# Patient Record
Sex: Female | Born: 1990 | Race: Black or African American | Hispanic: No | Marital: Single | Smoking: Current some day smoker
Health system: Southern US, Community
[De-identification: ages and names within clinical notes are randomized; demographics above are authoritative.]

## PROBLEM LIST (undated history)

## (undated) DIAGNOSIS — I10 Essential (primary) hypertension: Secondary | ICD-10-CM

---

## 2015-11-26 ENCOUNTER — Encounter (HOSPITAL_BASED_OUTPATIENT_CLINIC_OR_DEPARTMENT_OTHER): Payer: Self-pay | Admitting: *Deleted

## 2015-11-26 ENCOUNTER — Emergency Department (HOSPITAL_BASED_OUTPATIENT_CLINIC_OR_DEPARTMENT_OTHER)
Admission: EM | Admit: 2015-11-26 | Discharge: 2015-11-26 | Disposition: A | Discharge status: Home / Self Care | Payer: Self-pay | Attending: Emergency Medicine | Admitting: Emergency Medicine

## 2015-11-26 DIAGNOSIS — I10 Essential (primary) hypertension: Secondary | ICD-10-CM | POA: Insufficient documentation

## 2015-11-26 DIAGNOSIS — B0089 Other herpesviral infection: Secondary | ICD-10-CM | POA: Insufficient documentation

## 2015-11-26 DIAGNOSIS — Z113 Encounter for screening for infections with a predominantly sexual mode of transmission: Secondary | ICD-10-CM | POA: Insufficient documentation

## 2015-11-26 DIAGNOSIS — A599 Trichomoniasis, unspecified: Secondary | ICD-10-CM | POA: Insufficient documentation

## 2015-11-26 DIAGNOSIS — A6000 Herpesviral infection of urogenital system, unspecified: Secondary | ICD-10-CM

## 2015-11-26 HISTORY — DX: Essential (primary) hypertension: I10

## 2015-11-26 LAB — WET PREP, GENITAL
Sperm: NONE SEEN
Yeast Wet Prep HPF POC: NONE SEEN

## 2015-11-26 LAB — URINE MICROSCOPIC-ADD ON

## 2015-11-26 LAB — URINALYSIS, ROUTINE W REFLEX MICROSCOPIC
BILIRUBIN URINE: NEGATIVE
Glucose, UA: NEGATIVE mg/dL
KETONES UR: NEGATIVE mg/dL
NITRITE: NEGATIVE
PH: 7 (ref 5.0–8.0)
PROTEIN: NEGATIVE mg/dL
Specific Gravity, Urine: 1.015 (ref 1.005–1.030)

## 2015-11-26 LAB — PREGNANCY, URINE: Preg Test, Ur: NEGATIVE

## 2015-11-26 MED ORDER — ACYCLOVIR 400 MG PO TABS: 400.0000 mg | ORAL_TABLET | Freq: Three times a day (TID) | ORAL | 0 refills | Status: AC

## 2015-11-26 MED ORDER — CEFTRIAXONE SODIUM 250 MG IJ SOLR
250.0000 mg | Freq: Once | INTRAMUSCULAR | Status: AC
  Administered 2015-11-26: 250 mg via INTRAMUSCULAR
  Filled 2015-11-26: qty 250

## 2015-11-26 MED ORDER — METRONIDAZOLE 500 MG PO TABS
2000.0000 mg | ORAL_TABLET | Freq: Once | ORAL | Status: AC
  Administered 2015-11-26: 2000 mg via ORAL
  Filled 2015-11-26: qty 4

## 2015-11-26 MED ORDER — ONDANSETRON 4 MG PO TBDP
4.0000 mg | ORAL_TABLET | Freq: Once | ORAL | Status: AC
  Administered 2015-11-26: 4 mg via ORAL
  Filled 2015-11-26: qty 1

## 2015-11-26 MED ORDER — AZITHROMYCIN 1 G PO PACK
1.0000 g | PACK | Freq: Once | ORAL | Status: AC
  Administered 2015-11-26: 1 g via ORAL
  Filled 2015-11-26: qty 1

## 2015-11-26 NOTE — ED Notes (Signed)
Lab unable to run Herpes on swab collected. Lab has blood available for Herpes testing from blood work. Order changed.

## 2015-11-26 NOTE — Discharge Instructions (Signed)
Take the Acyclovir as prescribe. Follow up with your OB[facility]GYN [facility]. Your HIV, syphilis, gonorrhea, chlamydia, herpes test are still pending. If anything is positive you receive a phone call. You need to inform all your sexual partners that you've been treated for gonorrhea, chlamydia, trichomoniasis as they will need to be treated also. Be sure to use protection when having sex. You can follow-up with the health department for future STD testing. Return to emergency while with worsening symptoms or new concerning symptoms.

## 2015-11-26 NOTE — ED Triage Notes (Signed)
Pt c/o vaginal and rectal pain with lesions x 1 week

## 2015-11-26 NOTE — ED Provider Notes (Signed)
[facility]   CSN: [id number] Arrival date & time: 11/26/15  1634  By signing my name below, I, Patricia Logan, attest that this documentation has been prepared under the direction and in the presence of Patricia Marlin, PA-C. Electronically Signed: Freida Logan, Scribe. 11/26/2015. 7:20 PM. History   Chief Complaint Chief Complaint  Patient presents with  . Vaginal Pain   The history is provided by the patient. No language interpreter was used.    HPI Comments:  Patricia Logan is a 25 y.o. female who presents to the Emergency Department complaining of small vaginal "tear"/ lesion x 1 week with associated pruritic bumps to the anus. She denies dysuria but notes burning pain to the skin when she urinates. Pt is sexually active with both men and women and admits to having unprotected sex. She denies having sexual partners with similar symptoms. Pt also notes white vaginal discharge; no foul odor or vaginal itching. No abdominal pain, fever, nausea, vomiting, or blood in stool.  Past Medical History:  Diagnosis Date  . Hypertension     There are no active problems to display for this patient.   History reviewed. No pertinent surgical history.  OB History    No data available       Home Medications    Prior to Admission medications   Medication Sig Start Date End Date Taking? Authorizing Provider  acyclovir (ZOVIRAX) 400 MG tablet Take 1 tablet (400 mg total) by mouth 3 (three) times daily. 11/26/15   Jerre Simon, PA    Family History History reviewed. No pertinent family history.  Social History Social History  Substance Use Topics  . Smoking status: Never Smoker  . Smokeless tobacco: Never Used  . Alcohol use No     Allergies   Patient has no known allergies.   Review of Systems Review of Systems  Constitutional: Negative for fever.  Gastrointestinal: Negative for abdominal pain, blood in stool, nausea and vomiting.    Genitourinary: Positive for genital sores (tear/lesion) and vaginal discharge. Negative for dysuria.  All other systems reviewed and are negative.    Physical Exam Updated Vital Signs BP (!) 179/112   Pulse 90   Temp 98.1 F (36.7 C)   Resp 16   Ht 5\' 11"  (1.803 m)   Wt 245 lb (111.1 kg)   LMP 10/30/2015   SpO2 100%   BMI 34.17 kg/m   Physical Exam  Constitutional: She is oriented to person, place, and time. She appears well-developed and well-nourished. No distress.  HENT:  Head: Normocephalic and atraumatic.  Eyes: Conjunctivae are normal.  Neck: Normal range of motion.  Cardiovascular: Normal rate, regular rhythm and normal heart sounds.   No murmur heard. Pulmonary/Chest: Effort normal.  Abdominal: Soft. Normal appearance and bowel sounds are normal. She exhibits no distension. There is no tenderness.  Genitourinary:  Genitourinary Comments: Exam performed by Jerre Simon,  exam chaperoned Date: 11/26/2015 Pelvic exam: normal external genitalia without evidence of trauma. VULVA: ulcer appearing lesions noted to vulva and perineal region that are TTP. VAGINA: normal appearing vagina with normal color and discharge, no lesions. CERVIX: cervix with erythema and red lesions surrounding the os, cervical motion tenderness absent, cervical os closed with out purulent discharge; vaginal discharge - white, copious, creamy, mucoid and thick, Wet prep and DNA probe for chlamydia and GC obtained.   ADNEXA: normal adnexa in size, nontender and no masses UTERUS: uterus is normal size, shape, consistency and nontender.  Chaperone was present for exam which was performed with no discomfort or complications.   Musculoskeletal: Normal range of motion.  Neurological: She is alert and oriented to person, place, and time.  Skin: Skin is warm and dry. She is not diaphoretic.  Psychiatric: She has a normal mood and affect.  Nursing note and vitals reviewed.    ED Treatments /  Results  DIAGNOSTIC STUDIES:  Oxygen Saturation is 100% on RA, normal by my interpretation.    COORDINATION OF CARE:  7:04 PM Discussed treatment plan with pt at bedside and pt agreed to plan.  Labs (all labs ordered are listed, but only abnormal results are displayed) Labs Reviewed  WET PREP, GENITAL - Abnormal; Notable for the following:       Result Value   Trich, Wet Prep PRESENT (*)    Clue Cells Wet Prep HPF POC PRESENT (*)    WBC, Wet Prep HPF POC MANY (*)    All other components within normal limits  URINALYSIS, ROUTINE W REFLEX MICROSCOPIC (NOT AT [facility]) - Abnormal; Notable for the following:    Hgb urine dipstick TRACE (*)    Leukocytes, UA MODERATE (*)    All other components within normal limits  URINE MICROSCOPIC-ADD ON - Abnormal; Notable for the following:    Squamous Epithelial / LPF 0-5 (*)    Bacteria, UA RARE (*)    All other components within normal limits  PREGNANCY, URINE  RPR  HIV ANTIBODY (ROUTINE TESTING)  HERPES SIMPLEX VIRUS(HSV) DNA BY PCR  GC/CHLAMYDIA PROBE AMP ([place]) NOT AT [facility]    EKG  EKG Interpretation None       Radiology No results found.  Procedures Procedures (including critical care time)  Medications Ordered in ED Medications  metroNIDAZOLE (FLAGYL) tablet 2,000 mg (2,000 mg Oral Given 11/26/15 2037)  cefTRIAXone (ROCEPHIN) injection 250 mg (250 mg Intramuscular Given 11/26/15 2037)  azithromycin (ZITHROMAX) powder 1 g (1 g Oral Given 11/26/15 2037)  ondansetron (ZOFRAN-ODT) disintegrating tablet 4 mg (4 mg Oral Given 11/26/15 2037)     Initial Impression / Assessment and Plan / ED Course  I have reviewed the triage vital signs and the nursing notes.  Pertinent labs & imaging results that were available during my care of the patient were reviewed by me and considered in my medical decision making (see chart for details).  Clinical Course    Patient treated in the ED for STI with Azithromycin, Rocephin, Flagyl.  Patient was found to have trichomoniasis on wet prep. Patient's exam concerning for herpes. Patient advised to inform and treat all sexual partners.  Pt advised on safe sex practices and understands that they have GC/Chlamydia and herpes cultures pending and will result in 2-3 days. HIV and RPR sent. Patient discharged with acyclovir. Inserted patient follow with her OB/GYN on Monday to be reevaluated. Pt encouraged to follow up at local health department for future STI checks. No concern for PID. Discussed return precautions. Pt appears safe for discharge.   Final Clinical Impressions(s) / ED Diagnoses   Final diagnoses:  Trichimoniasis  Screen for STD (sexually transmitted disease)  Genital herpes simplex, unspecified site    New Prescriptions Discharge Medication List as of 11/26/2015  8:53 PM    START taking these medications   Details  acyclovir (ZOVIRAX) 400 MG tablet Take 1 tablet (400 mg total) by mouth 3 (three) times daily., Starting Fri 11/26/2015, Print       I personally performed the services described in  this documentation, which was scribed in my presence. The recorded information has been reviewed and is accurate.       Jerre SimonJessica L Ausha Sieh, PA 11/26/15 2151    Laurence Spatesachel Morgan Little, MD 11/30/15 [phone]

## 2015-11-28 LAB — HSV(HERPES SIMPLEX VRS) I + II AB-IGG
HSV 1 Glycoprotein G Ab, IgG: 19.2 index — ABNORMAL HIGH (ref 0.00–0.90)
HSV 2 Glycoprotein G Ab, IgG: <0.91 index (ref 0.00–0.90)

## 2015-11-28 LAB — RPR: RPR: NONREACTIVE

## 2015-11-28 LAB — HIV ANTIBODY (ROUTINE TESTING W REFLEX): HIV Screen 4th Generation wRfx: NONREACTIVE

## 2015-12-01 LAB — GC/CHLAMYDIA PROBE AMP (~~LOC~~) NOT AT ARMC
Chlamydia: NEGATIVE
NEISSERIA GONORRHEA: NEGATIVE

## 2019-09-14 ENCOUNTER — Emergency Department (HOSPITAL_BASED_OUTPATIENT_CLINIC_OR_DEPARTMENT_OTHER)
Admission: EM | Admit: 2019-09-14 | Discharge: 2019-09-15 | Disposition: A | Discharge status: Home / Self Care | Payer: BLUE CROSS/BLUE SHIELD | Attending: Emergency Medicine | Admitting: Emergency Medicine

## 2019-09-14 ENCOUNTER — Other Ambulatory Visit: Payer: Self-pay

## 2019-09-14 ENCOUNTER — Encounter (HOSPITAL_BASED_OUTPATIENT_CLINIC_OR_DEPARTMENT_OTHER): Payer: Self-pay | Admitting: Emergency Medicine

## 2019-09-14 DIAGNOSIS — R102 Pelvic and perineal pain: Secondary | ICD-10-CM | POA: Diagnosis not present

## 2019-09-14 DIAGNOSIS — Z5321 Procedure and treatment not carried out due to patient leaving prior to being seen by health care provider: Secondary | ICD-10-CM | POA: Insufficient documentation

## 2019-09-14 NOTE — ED Triage Notes (Signed)
Reports pelvic pain for the last three days.  Denies any urinary symptoms or discharge.  Endorses pain is worse with having a bm.

## 2020-06-15 ENCOUNTER — Other Ambulatory Visit: Payer: Self-pay

## 2020-06-15 ENCOUNTER — Encounter (HOSPITAL_BASED_OUTPATIENT_CLINIC_OR_DEPARTMENT_OTHER): Payer: Self-pay | Admitting: Emergency Medicine

## 2020-06-15 ENCOUNTER — Emergency Department (HOSPITAL_BASED_OUTPATIENT_CLINIC_OR_DEPARTMENT_OTHER)
Admission: EM | Admit: 2020-06-15 | Discharge: 2020-06-16 | Disposition: A | Discharge status: Home / Self Care | Payer: BLUE CROSS/BLUE SHIELD | Attending: Emergency Medicine | Admitting: Emergency Medicine

## 2020-06-15 DIAGNOSIS — R0789 Other chest pain: Secondary | ICD-10-CM | POA: Insufficient documentation

## 2020-06-15 DIAGNOSIS — R072 Precordial pain: Secondary | ICD-10-CM | POA: Diagnosis not present

## 2020-06-15 DIAGNOSIS — I10 Essential (primary) hypertension: Secondary | ICD-10-CM | POA: Insufficient documentation

## 2020-06-15 DIAGNOSIS — R0602 Shortness of breath: Secondary | ICD-10-CM | POA: Insufficient documentation

## 2020-06-15 DIAGNOSIS — F17290 Nicotine dependence, other tobacco product, uncomplicated: Secondary | ICD-10-CM | POA: Insufficient documentation

## 2020-06-15 DIAGNOSIS — Z79899 Other long term (current) drug therapy: Secondary | ICD-10-CM | POA: Insufficient documentation

## 2020-06-15 NOTE — ED Triage Notes (Signed)
CP and SOB started about 2-3pm left work tonight because it has gotten worse. No HX. Upper mid-left chest does not radiate.

## 2020-06-16 ENCOUNTER — Emergency Department (HOSPITAL_BASED_OUTPATIENT_CLINIC_OR_DEPARTMENT_OTHER): Payer: BLUE CROSS/BLUE SHIELD

## 2020-06-16 LAB — CBC WITH DIFFERENTIAL/PLATELET
Abs Immature Granulocytes: 0.02 10*3/uL (ref 0.00–0.07)
Basophils Absolute: 0.1 10*3/uL (ref 0.0–0.1)
Basophils Relative: 1 %
Eosinophils Absolute: 0.5 10*3/uL (ref 0.0–0.5)
Eosinophils Relative: 5 %
HCT: 39.4 % (ref 36.0–46.0)
Hemoglobin: 12.6 g/dL (ref 12.0–15.0)
Immature Granulocytes: 0 %
Lymphocytes Relative: 45 %
Lymphs Abs: 4.1 10*3/uL — ABNORMAL HIGH (ref 0.7–4.0)
MCH: 27.1 pg (ref 26.0–34.0)
MCHC: 32 g/dL (ref 30.0–36.0)
MCV: 84.7 fL (ref 80.0–100.0)
Monocytes Absolute: 0.5 10*3/uL (ref 0.1–1.0)
Monocytes Relative: 6 %
Neutro Abs: 4 10*3/uL (ref 1.7–7.7)
Neutrophils Relative %: 43 %
Platelets: 386 10*3/uL (ref 150–400)
RBC: 4.65 MIL/uL (ref 3.87–5.11)
RDW: 13.9 % (ref 11.5–15.5)
WBC: 9.2 10*3/uL (ref 4.0–10.5)
nRBC: 0 % (ref 0.0–0.2)

## 2020-06-16 LAB — COMPREHENSIVE METABOLIC PANEL
ALT: 21 U/L (ref 0–44)
AST: 20 U/L (ref 15–41)
Albumin: 4.5 g/dL (ref 3.5–5.0)
Alkaline Phosphatase: 50 U/L (ref 38–126)
Anion gap: 10 (ref 5–15)
BUN: 13 mg/dL (ref 6–20)
CO2: 30 mmol/L (ref 22–32)
Calcium: 9.8 mg/dL (ref 8.9–10.3)
Chloride: 99 mmol/L (ref 98–111)
Creatinine, Ser: 0.81 mg/dL (ref 0.44–1.00)
GFR, Estimated: >60 mL/min (ref >60)
Glucose, Bld: 122 mg/dL — ABNORMAL HIGH (ref 70–99)
Potassium: 3.4 mmol/L — ABNORMAL LOW (ref 3.5–5.1)
Sodium: 139 mmol/L (ref 135–145)
Total Bilirubin: 0.2 mg/dL — ABNORMAL LOW (ref 0.3–1.2)
Total Protein: 8 g/dL (ref 6.5–8.1)

## 2020-06-16 LAB — LIPASE, BLOOD: Lipase: 37 U/L (ref 11–51)

## 2020-06-16 LAB — BRAIN NATRIURETIC PEPTIDE: B Natriuretic Peptide: 6.1 pg/mL (ref 0.0–100.0)

## 2020-06-16 LAB — TROPONIN I (HIGH SENSITIVITY)
Troponin I (High Sensitivity): 3 ng/L (ref <18)
Troponin I (High Sensitivity): 3 ng/L (ref <18)

## 2020-06-16 LAB — D-DIMER, QUANTITATIVE: D-Dimer, Quant: 0.37 ug/mL-FEU (ref 0.00–0.50)

## 2020-06-16 MED ORDER — CYCLOBENZAPRINE HCL 10 MG PO TABS: 10.0000 mg | ORAL_TABLET | Freq: Two times a day (BID) | ORAL | 0 refills | Status: AC | PRN

## 2020-06-16 NOTE — ED Provider Notes (Signed)
[facility] EMERGENCY DEPARTMENT Provider Note   CSN: [id number] Arrival date & time: 06/15/20  2340     History Chief Complaint  Patient presents with   Chest Pain    Izabella Jagoda is a 30 y.o. female.  The history is provided by the patient and medical records. No language interpreter was used.  Chest Pain Pain location:  L chest and substernal area Pain quality: aching, crushing, pressure and sharp   Pain quality: not radiating   Pain radiates to:  Does not radiate Pain severity:  Moderate Onset quality:  Gradual Duration:  2 weeks Timing:  Constant Progression:  Waxing and waning Chronicity:  New Relieved by:  Nothing Worsened by:  Deep breathing, exertion and movement Ineffective treatments:  None tried Associated symptoms: shortness of breath   Associated symptoms: no abdominal pain, no altered mental status, no back pain, no cough, no dizziness, no fatigue, no fever, no headache, no nausea, no near-syncope, no numbness, no palpitations, no vomiting and no weakness       Past Medical History:  Diagnosis Date   Hypertension     There are no problems to display for this patient.   History reviewed. No pertinent surgical history.   OB History   No obstetric history on file.     History reviewed. No pertinent family history.  Social History   Tobacco Use   Smoking status: Some Days    Pack years: 0.00    Types: Cigars   Smokeless tobacco: Never  Substance Use Topics   Alcohol use: No   Drug use: Yes    Comment: occ    Home Medications Prior to Admission medications   Medication Sig Start Date End Date Taking? Authorizing Provider  amLODipine (NORVASC) 5 MG tablet Take 1 tablet by mouth daily. 06/10/20  Yes [provider]  furosemide (LASIX) 20 MG tablet Take 20 mg by mouth.   Yes [provider]  losartan-hydrochlorothiazide (HYZAAR) 50-12.5 MG tablet Take 1 tablet by mouth daily. 06/10/20  Yes [provider]  acyclovir (ZOVIRAX) 400 MG tablet Take 1 tablet (400 mg total) by mouth 3 (three) times daily. 11/26/15   Jerre Simon, PA    Allergies    Patient has no known allergies.  Review of Systems   Review of Systems  Constitutional:  Negative for chills, fatigue and fever.  HENT:  Negative for congestion.   Respiratory:  Positive for shortness of breath. Negative for cough, chest tightness, wheezing and stridor.   Cardiovascular:  Positive for chest pain. Negative for palpitations, leg swelling (pt reports mild swelling in bilat ankles but not seen today) and near-syncope.  Gastrointestinal:  Negative for abdominal pain, constipation, diarrhea, nausea and vomiting.  Genitourinary:  Negative for dysuria.  Musculoskeletal:  Negative for back pain and neck pain.  Skin:  Negative for rash.  Neurological:  Negative for dizziness, weakness, light-headedness, numbness and headaches.  Psychiatric/Behavioral:  Negative for agitation.   All other systems reviewed and are negative.  Physical Exam Updated Vital Signs BP (!) 188/115 (BP Location: Left Arm)   Pulse 98   Temp 98.3 F (36.8 C) (Oral)   Resp 20   Ht 5\' 11"  (1.803 m)   Wt 122.5 kg   LMP 06/08/2020 (Exact Date)   SpO2 100%   BMI 37.66 kg/m   Physical Exam Vitals and nursing note reviewed.  Constitutional:      General: She is not in acute distress.    Appearance:  She is well-developed. She is not ill-appearing, toxic-appearing or diaphoretic.  HENT:     Head: Normocephalic and atraumatic.  Eyes:     Extraocular Movements: Extraocular movements intact.     Conjunctiva/sclera: Conjunctivae normal.     Pupils: Pupils are equal, round, and reactive to light.  Cardiovascular:     Rate and Rhythm: Regular rhythm. Tachycardia present.     Heart sounds: No murmur heard. Pulmonary:     Effort: Pulmonary effort is normal. No respiratory distress.     Breath sounds: Normal breath sounds. No decreased breath sounds, wheezing,  rhonchi or rales.  Chest:     Chest wall: Tenderness present.  Abdominal:     General: There is no abdominal bruit.     Palpations: Abdomen is soft.     Tenderness: There is no abdominal tenderness.  Musculoskeletal:     Cervical back: Neck supple.     Right lower leg: No tenderness. No edema.     Left lower leg: No tenderness. No edema.  Skin:    General: Skin is warm and dry.     Capillary Refill: Capillary refill takes less than 2 seconds.     Findings: No erythema.  Neurological:     General: No focal deficit present.     Mental Status: She is alert.  Psychiatric:        Mood and Affect: Mood normal.    ED Results / Procedures / Treatments   Labs (all labs ordered are listed, but only abnormal results are displayed) Labs Reviewed  CBC WITH DIFFERENTIAL/PLATELET - Abnormal; Notable for the following components:      Result Value   Lymphs Abs 4.1 (*)    All other components within normal limits  COMPREHENSIVE METABOLIC PANEL - Abnormal; Notable for the following components:   Potassium 3.4 (*)    Glucose, Bld 122 (*)    Total Bilirubin 0.2 (*)    All other components within normal limits  LIPASE, BLOOD  D-DIMER, QUANTITATIVE  BRAIN NATRIURETIC PEPTIDE  PREGNANCY, URINE  TROPONIN I (HIGH SENSITIVITY)  TROPONIN I (HIGH SENSITIVITY)    EKG EKG Interpretation  Date/Time:  Tuesday June 15 2020 23:48:58 EDT Ventricular Rate:  91 PR Interval:  202 QRS Duration: 86 QT Interval:  364 QTC Calculation: 447 R Axis:   60 Text Interpretation: Normal sinus rhythm T wave abnormality, consider inferior ischemia Abnormal ECG No prior ECG for comparison. T wave inversions present. No STEMI Confirmed by Theda Belfast ([id number]) on 06/15/2020 11:56:55 PM  Radiology DG Chest 2 View  Result Date: 06/16/2020 CLINICAL DATA:  Left side chest pain, shortness of breath EXAM: CHEST - 2 VIEW COMPARISON:  None. FINDINGS: Heart and mediastinal contours are within normal limits. No focal  opacities or effusions. No acute bony abnormality. IMPRESSION: No active cardiopulmonary disease. Electronically Signed   By: Charlett Nose M.D.   On: 06/16/2020 01:18    Procedures Procedures   Medications Ordered in ED Medications - No data to display  ED Course  I have reviewed the triage vital signs and the nursing notes.  Pertinent labs & imaging results that were available during my care of the patient were reviewed by me and considered in my medical decision making (see chart for details).    MDM Rules/Calculators/A&P                          Sadira Zamorano is a 30 y.o. female with a past  medical history significant for hypertension who presents with chest pain and shortness of breath.  According patient, for the last 2 weeks she has had daily pain in her left central chest that does not radiate.  She reports it is both pleuritic and exertional and she has felt lightheaded.  She reports that some shortness of breath.  She denies nausea, vomiting, fevers, chills, chest, or cough.  Denies constipation, diarrhea, urinary changes.  She thinks her ankles have been slightly swollen but acknowledges they do not seem swollen currently.  She reports no trauma.  No recent COVID exposures.  She reports the pain is a pressure and dull pain at this time.  She denies any change with eating, drinking, or spicy things.  On exam, chest is tender to palpation reproducing her discomfort.  Lungs were clear.  Abdomen nontender.  No edema appreciated.  Good pulses in all extremities.  Patient resting comfortably but was slightly tachycardic on my exam.  Blood pressure was normal.  EKG does show some T wave inversions.  She denies any family history of early heart disease.  Heart score calculated as a 3.  Patient will get work-up including D-dimer given her tachycardia on monitor evaluation, delta troponin, and labs.  We will get chest x-ray.  Given the tenderness, suspect a musculoskeletal chest wall  discomfort however anticipate reassessment after work-up.  4:01 AM Delta troponin came back negative.  D-dimer negative.  BNP not elevated.  Chest x-ray reassuring.  Other work-up also reassuring.  Mild hypokalemia which we discussed.  Suspect musculoskeletal chest wall discomfort.  Patient is agreeable to muscle relaxant and follow-up with PCP.  She had no other questions or concerns and was discharged in good condition with well appearance and reassuring vital signs.   Final Clinical Impression(s) / ED Diagnoses Final diagnoses:  Atypical chest pain  Chest wall tenderness    Rx / DC Orders ED Discharge Orders          Ordered    cyclobenzaprine (FLEXERIL) 10 MG tablet  2 times daily PRN        06/16/20 0402           Clinical Impression: 1. Atypical chest pain   2. Chest wall tenderness     Disposition: Discharge  Condition: Good  I have discussed the results, Dx and Tx plan with the pt(& family if present). He/she/they expressed understanding and agree(s) with the plan. Discharge instructions discussed at great length. Strict return precautions discussed and pt &/or family have verbalized understanding of the instructions. No further questions at time of discharge.    New Prescriptions   CYCLOBENZAPRINE (FLEXERIL) 10 MG TABLET    Take 1 tablet (10 mg total) by mouth 2 (two) times daily as needed for muscle spasms.    Follow Up: [facility] [address] [place] [address] [phone] Schedule an appointment as soon as possible for a visit    [facility] [place] [facility]PARTMENT [address] [id number] [address] [place] [address] [phone]       Shamonica Schadt, Canary Brim, MD 06/16/20 [phone]

## 2020-06-16 NOTE — ED Notes (Signed)
Patient transported to X-ray 

## 2020-06-16 NOTE — Discharge Instructions (Addendum)
Your work-up today was overall reassuring.  Your heart enzymes were negative and your D-dimer to rule out blood clot was negative.  Your x-ray was reassuring.  Your EKG did not show acute arrhythmia.  Given your tenderness, we suspect chest wall discomfort.  Please use the muscle relaxant to help with her muscle spasms present.  Please follow-up with your primary doctor and rest.  If any symptoms change or worsen acutely, please return to the nearest emergency department.

## 2021-04-18 ENCOUNTER — Other Ambulatory Visit: Payer: Self-pay

## 2021-04-18 ENCOUNTER — Emergency Department (HOSPITAL_BASED_OUTPATIENT_CLINIC_OR_DEPARTMENT_OTHER)
Admission: EM | Admit: 2021-04-18 | Discharge: 2021-04-18 | Disposition: A | Discharge status: Home / Self Care | Payer: 59 | Attending: Emergency Medicine | Admitting: Emergency Medicine

## 2021-04-18 ENCOUNTER — Emergency Department (HOSPITAL_BASED_OUTPATIENT_CLINIC_OR_DEPARTMENT_OTHER): Payer: 59

## 2021-04-18 ENCOUNTER — Encounter (HOSPITAL_BASED_OUTPATIENT_CLINIC_OR_DEPARTMENT_OTHER): Payer: Self-pay

## 2021-04-18 DIAGNOSIS — I10 Essential (primary) hypertension: Secondary | ICD-10-CM | POA: Diagnosis not present

## 2021-04-18 DIAGNOSIS — Z79899 Other long term (current) drug therapy: Secondary | ICD-10-CM | POA: Insufficient documentation

## 2021-04-18 DIAGNOSIS — R0789 Other chest pain: Secondary | ICD-10-CM | POA: Diagnosis present

## 2021-04-18 DIAGNOSIS — R0602 Shortness of breath: Secondary | ICD-10-CM | POA: Diagnosis not present

## 2021-04-18 DIAGNOSIS — R062 Wheezing: Secondary | ICD-10-CM | POA: Insufficient documentation

## 2021-04-18 DIAGNOSIS — R059 Cough, unspecified: Secondary | ICD-10-CM | POA: Diagnosis not present

## 2021-04-18 LAB — CBC
HCT: 38.5 % (ref 36.0–46.0)
Hemoglobin: 12.2 g/dL (ref 12.0–15.0)
MCH: 26.4 pg (ref 26.0–34.0)
MCHC: 31.7 g/dL (ref 30.0–36.0)
MCV: 83.3 fL (ref 80.0–100.0)
Platelets: 396 10*3/uL (ref 150–400)
RBC: 4.62 MIL/uL (ref 3.87–5.11)
RDW: 14 % (ref 11.5–15.5)
WBC: 8 10*3/uL (ref 4.0–10.5)
nRBC: 0 % (ref 0.0–0.2)

## 2021-04-18 LAB — BASIC METABOLIC PANEL
Anion gap: 10 (ref 5–15)
BUN: 11 mg/dL (ref 6–20)
CO2: 27 mmol/L (ref 22–32)
Calcium: 9.9 mg/dL (ref 8.9–10.3)
Chloride: 100 mmol/L (ref 98–111)
Creatinine, Ser: 0.83 mg/dL (ref 0.44–1.00)
GFR, Estimated: >60 mL/min (ref >60)
Glucose, Bld: 103 mg/dL — ABNORMAL HIGH (ref 70–99)
Potassium: 3.4 mmol/L — ABNORMAL LOW (ref 3.5–5.1)
Sodium: 137 mmol/L (ref 135–145)

## 2021-04-18 LAB — TROPONIN I (HIGH SENSITIVITY): Troponin I (High Sensitivity): 2 ng/L (ref <18)

## 2021-04-18 LAB — HEPATIC FUNCTION PANEL
ALT: 19 U/L (ref 0–44)
AST: 19 U/L (ref 15–41)
Albumin: 4.6 g/dL (ref 3.5–5.0)
Alkaline Phosphatase: 49 U/L (ref 38–126)
Bilirubin, Direct: <0.1 mg/dL (ref 0.0–0.2)
Total Bilirubin: 0.4 mg/dL (ref 0.3–1.2)
Total Protein: 8.5 g/dL — ABNORMAL HIGH (ref 6.5–8.1)

## 2021-04-18 LAB — LIPASE, BLOOD: Lipase: 36 U/L (ref 11–51)

## 2021-04-18 LAB — PREGNANCY, URINE: Preg Test, Ur: NEGATIVE

## 2021-04-18 MED ORDER — AEROCHAMBER PLUS FLO-VU MEDIUM MISC
1.0000 | Freq: Once | Status: AC
  Administered 2021-04-18: 1
  Filled 2021-04-18: qty 1

## 2021-04-18 MED ORDER — ALBUTEROL SULFATE HFA 108 (90 BASE) MCG/ACT IN AERS
1.0000 | INHALATION_SPRAY | Freq: Once | RESPIRATORY_TRACT | Status: AC
  Administered 2021-04-18: 1 via RESPIRATORY_TRACT
  Filled 2021-04-18: qty 6.7

## 2021-04-18 MED ORDER — PREDNISONE 10 MG PO TABS
40.0000 mg | ORAL_TABLET | Freq: Every day | ORAL | 0 refills | Status: AC
Start: 2021-04-18 — End: 2021-04-22

## 2021-04-18 MED ORDER — PREDNISONE 20 MG PO TABS
40.0000 mg | ORAL_TABLET | Freq: Once | ORAL | Status: AC
  Administered 2021-04-18: 40 mg via ORAL
  Filled 2021-04-18: qty 2

## 2021-04-18 NOTE — ED Triage Notes (Signed)
C/o intermittent midsternal chest pain & shortness of breath x 1 month. States pain worse after coughing. ?

## 2021-04-18 NOTE — Discharge Instructions (Signed)
Take the steroids as directed. ?Continue using your inhaler as needed. ?Return to the ER if you start to experience worsening symptoms, leg swelling, continued chest pain. ?

## 2021-04-18 NOTE — ED Provider Notes (Signed)
?[facility] EMERGENCY DEPARTMENT ?Provider Note ? ? ?CSN: [id number] ?Arrival date & time: 04/18/21  1444 ? ?  ? ?History ? ?Chief Complaint  ?Patient presents with  ? Chest Pain  ? ? ?Patricia Logan is a 31 y.o. female with a past medical history of hypertension presenting to the ED with a chief complaint of chest pain, shortness of breath and wheezing.  Reports central chest pain describes as a tightening sensation.  Reports associated productive cough with yellow mucus.  Reports symptoms worsened today after being intermittent for about a month.  She also had similar symptoms at the end of last year which she received outpatient work-up for however was unable to complete the echo she was ordered.  She was given nitroglycerin for this pain last year but did not take it during this episode.  She never been told she has asthma, COPD or other lung disease.  She denies any leg swelling, recent immobilization, hemoptysis, history of DVT or PE, exogenous estrogen use.  Denies any use of inhaler in the past. ? ? ?Chest Pain ?Associated symptoms: cough and shortness of breath   ?Associated symptoms: no abdominal pain, no dizziness, no fever, no nausea, no palpitations, no vomiting and no weakness   ? ?  ? ?Home Medications ?Prior to Admission medications   ?Medication Sig Start Date End Date Taking? Authorizing Provider  ?predniSONE (DELTASONE) 10 MG tablet Take 4 tablets (40 mg total) by mouth daily for 4 days. 04/18/21 04/22/21 Yes Jomarion Mish, PA-C  ?acyclovir (ZOVIRAX) 400 MG tablet Take 1 tablet (400 mg total) by mouth 3 (three) times daily. 11/26/15   Focht, Joyce Copa, PA  ?amLODipine (NORVASC) 5 MG tablet Take 1 tablet by mouth daily. 06/10/20   [provider]  ?cyclobenzaprine (FLEXERIL) 10 MG tablet Take 1 tablet (10 mg total) by mouth 2 (two) times daily as needed for muscle spasms. 06/16/20   Tegeler, Canary Brim, MD  ?furosemide (LASIX) 20 MG tablet Take 20 mg by mouth.    [provider]  ?losartan-hydrochlorothiazide (HYZAAR) 50-12.5 MG tablet Take 1 tablet by mouth daily. 06/10/20   [provider]  ?   ? ?Allergies    ?Patient has no known allergies.   ? ?Review of Systems   ?Review of Systems  ?Constitutional:  Negative for appetite change, chills and fever.  ?HENT:  Negative for ear pain, rhinorrhea, sneezing and sore throat.   ?Eyes:  Negative for photophobia and visual disturbance.  ?Respiratory:  Positive for cough, chest tightness, shortness of breath and wheezing.   ?Cardiovascular:  Positive for chest pain. Negative for palpitations.  ?Gastrointestinal:  Negative for abdominal pain, blood in stool, constipation, diarrhea, nausea and vomiting.  ?Genitourinary:  Negative for dysuria, hematuria and urgency.  ?Musculoskeletal:  Negative for myalgias.  ?Skin:  Negative for rash.  ?Neurological:  Negative for dizziness, weakness and light-headedness.  ? ?Physical Exam ?Updated Vital Signs ?BP 119/66   Pulse 68   Temp 97.7 ?F (36.5 ?C) (Oral)   Resp 14   Ht 5\' 11"  (1.803 m)   Wt 122.5 kg   LMP 04/04/2021 (Approximate)   SpO2 98%   BMI 37.66 kg/m?  ?Physical Exam ?Vitals and nursing note reviewed.  ?Constitutional:   ?   General: She is not in acute distress. ?   Appearance: She is well-developed.  ?HENT:  ?   Head: Normocephalic and atraumatic.  ?   Nose: Nose normal.  ?Eyes:  ?   General: No  scleral icterus.    ?   Left eye: No discharge.  ?   Conjunctiva/sclera: Conjunctivae normal.  ?Cardiovascular:  ?   Rate and Rhythm: Normal rate and regular rhythm.  ?   Heart sounds: Normal heart sounds. No murmur heard. ?  No friction rub. No gallop.  ?Pulmonary:  ?   Effort: Pulmonary effort is normal. No respiratory distress.  ?   Breath sounds: Examination of the right-middle field reveals wheezing. Examination of the left-middle field reveals wheezing. Examination of the right-lower field reveals wheezing. Examination of the left-lower field reveals wheezing. Wheezing  present.  ?Abdominal:  ?   General: Bowel sounds are normal. There is no distension.  ?   Palpations: Abdomen is soft.  ?   Tenderness: There is no abdominal tenderness. There is no guarding.  ?Musculoskeletal:     ?   General: Normal range of motion.  ?   Cervical back: Normal range of motion and neck supple.  ?   Comments: No lower extremity edema, erythema or calf tenderness bilaterally.  ?Skin: ?   General: Skin is warm and dry.  ?   Findings: No rash.  ?Neurological:  ?   Mental Status: She is alert.  ?   Motor: No abnormal muscle tone.  ?   Coordination: Coordination normal.  ? ? ?ED Results / Procedures / Treatments   ?Labs ?(all labs ordered are listed, but only abnormal results are displayed) ?Labs Reviewed  ?BASIC METABOLIC PANEL - Abnormal; Notable for the following components:  ?    Result Value  ? Potassium 3.4 (*)   ? Glucose, Bld 103 (*)   ? All other components within normal limits  ?HEPATIC FUNCTION PANEL - Abnormal; Notable for the following components:  ? Total Protein 8.5 (*)   ? All other components within normal limits  ?CBC  ?PREGNANCY, URINE  ?LIPASE, BLOOD  ?TROPONIN I (HIGH SENSITIVITY)  ? ? ?EKG ?EKG Interpretation ? ?Date/Time:  Monday April 18 2021 14:57:42 EDT ?Ventricular Rate:  78 ?PR Interval:  186 ?QRS Duration: 84 ?QT Interval:  376 ?QTC Calculation: 428 ?R Axis:   69 ?Text Interpretation: Normal sinus rhythm T wave abnormality, consider inferior ischemia Abnormal ECG When compared with ECG of 15-Jun-2020 23:48, PREVIOUS ECG IS PRESENT when compared to prior, similar appearance. No STEMI Confirmed by Theda Belfast ([id number]) on 04/18/2021 4:25:15 PM ? ?Radiology ?DG Chest 2 View ? ?Result Date: 04/18/2021 ?CLINICAL DATA:  Chest pain, shortness of breath. EXAM: CHEST - 2 VIEW COMPARISON:  July 29, 2020. FINDINGS: The heart size and mediastinal contours are within normal limits. Both lungs are clear. The visualized skeletal structures are unremarkable. IMPRESSION: No active  cardiopulmonary disease. Electronically Signed   By: Lupita Raider M.D.   On: 04/18/2021 15:10   ? ?Procedures ?Procedures  ? ? ?Medications Ordered in ED ?Medications  ?albuterol (VENTOLIN HFA) 108 (90 Base) MCG/ACT inhaler 1 puff (1 puff Inhalation Given 04/18/21 1704)  ?AeroChamber Plus Flo-Vu Medium MISC 1 each (1 each Other Given 04/18/21 1705)  ?predniSONE (DELTASONE) tablet 40 mg (40 mg Oral Given 04/18/21 1719)  ? ? ?ED Course/ Medical Decision Making/ A&P ?Clinical Course as of 04/18/21 1825  ?Mon Apr 18, 2021  ?1610 Troponin I (High Sensitivity): 2 [HK]  ?1638 DG Chest 2 View ?Negative [HK]  ?  ?Clinical Course User Index ?[HK] Chamia Schmutz, PA-C  ? ?                        ?  Medical Decision Making ?Amount and/or Complexity of Data Reviewed ?Labs: ordered. Decision-making details documented in ED Course. ?Radiology: ordered. Decision-making details documented in ED Course. ? ?Risk ?Prescription drug management. ? ? ?31 year old female with past medical history of hypertension presenting to the ED with a chief complaint of chest tightness, shortness of breath and wheezing.  Reports associated productive cough with yellow mucus.  Symptoms worsened today after being intermittent for about a month.  Similar symptoms at the end of last year for which she received outpatient work-up for without significant trigger found.  She was given nitroglycerin last year for this pain but did not take it during this episode.  Previous tobacco use but none currently.  On exam she has some wheezing noted in bilateral lower lung fields.  She is not tachycardic, tachypneic or hypoxic.  She has no lower extremity edema, erythema or calf tenderness concerning for DVT.  No recent immobilization or estrogen use.  Troponin here is negative.  BMP, CBC, lipase unremarkable.  EKG shows normal sinus rhythm, no ischemic changes, no STEMI, no changes from prior tracings.  Chest x-ray shows no acute findings.  Patient was given albuterol  and prednisone here for possible viral cause of URI. I doubt ACS as the cause of her pain, doubt PE due to her risk factors and vital signs here.  We will have her continue steroids at home as well as albuterol inhaler.  Lung exam im

## 2021-06-16 ENCOUNTER — Emergency Department (HOSPITAL_BASED_OUTPATIENT_CLINIC_OR_DEPARTMENT_OTHER): Payer: 59

## 2021-06-16 ENCOUNTER — Emergency Department (HOSPITAL_BASED_OUTPATIENT_CLINIC_OR_DEPARTMENT_OTHER)
Admission: EM | Admit: 2021-06-16 | Discharge: 2021-06-16 | Disposition: A | Discharge status: Home / Self Care | Payer: 59 | Attending: Emergency Medicine | Admitting: Emergency Medicine

## 2021-06-16 ENCOUNTER — Encounter (HOSPITAL_BASED_OUTPATIENT_CLINIC_OR_DEPARTMENT_OTHER): Payer: Self-pay

## 2021-06-16 ENCOUNTER — Other Ambulatory Visit: Payer: Self-pay

## 2021-06-16 DIAGNOSIS — R0789 Other chest pain: Secondary | ICD-10-CM | POA: Insufficient documentation

## 2021-06-16 DIAGNOSIS — R000 Tachycardia, unspecified: Secondary | ICD-10-CM | POA: Insufficient documentation

## 2021-06-16 DIAGNOSIS — R9389 Abnormal findings on diagnostic imaging of other specified body structures: Secondary | ICD-10-CM

## 2021-06-16 DIAGNOSIS — Z79899 Other long term (current) drug therapy: Secondary | ICD-10-CM | POA: Insufficient documentation

## 2021-06-16 DIAGNOSIS — R079 Chest pain, unspecified: Secondary | ICD-10-CM | POA: Diagnosis present

## 2021-06-16 DIAGNOSIS — R0602 Shortness of breath: Secondary | ICD-10-CM | POA: Diagnosis not present

## 2021-06-16 DIAGNOSIS — I10 Essential (primary) hypertension: Secondary | ICD-10-CM | POA: Insufficient documentation

## 2021-06-16 DIAGNOSIS — R918 Other nonspecific abnormal finding of lung field: Secondary | ICD-10-CM | POA: Diagnosis not present

## 2021-06-16 LAB — BASIC METABOLIC PANEL
Anion gap: 10 (ref 5–15)
BUN: 8 mg/dL (ref 6–20)
CO2: 30 mmol/L (ref 22–32)
Calcium: 9.5 mg/dL (ref 8.9–10.3)
Chloride: 97 mmol/L — ABNORMAL LOW (ref 98–111)
Creatinine, Ser: 0.82 mg/dL (ref 0.44–1.00)
GFR, Estimated: >60 mL/min (ref >60)
Glucose, Bld: 101 mg/dL — ABNORMAL HIGH (ref 70–99)
Potassium: 3.2 mmol/L — ABNORMAL LOW (ref 3.5–5.1)
Sodium: 137 mmol/L (ref 135–145)

## 2021-06-16 LAB — TROPONIN I (HIGH SENSITIVITY)
Troponin I (High Sensitivity): <2 ng/L (ref <18)
Troponin I (High Sensitivity): <2 ng/L (ref <18)

## 2021-06-16 LAB — CBC
HCT: 36.4 % (ref 36.0–46.0)
Hemoglobin: 11.6 g/dL — ABNORMAL LOW (ref 12.0–15.0)
MCH: 26.2 pg (ref 26.0–34.0)
MCHC: 31.9 g/dL (ref 30.0–36.0)
MCV: 82.2 fL (ref 80.0–100.0)
Platelets: 390 10*3/uL (ref 150–400)
RBC: 4.43 MIL/uL (ref 3.87–5.11)
RDW: 15 % (ref 11.5–15.5)
WBC: 15.6 10*3/uL — ABNORMAL HIGH (ref 4.0–10.5)
nRBC: 0 % (ref 0.0–0.2)

## 2021-06-16 LAB — URINALYSIS, ROUTINE W REFLEX MICROSCOPIC
Bilirubin Urine: NEGATIVE
Glucose, UA: NEGATIVE mg/dL
Ketones, ur: NEGATIVE mg/dL
Leukocytes,Ua: NEGATIVE
Nitrite: NEGATIVE
Protein, ur: NEGATIVE mg/dL
Specific Gravity, Urine: 1.02 (ref 1.005–1.030)
pH: 5.5 (ref 5.0–8.0)

## 2021-06-16 LAB — URINALYSIS, MICROSCOPIC (REFLEX): WBC, UA: NONE SEEN WBC/hpf (ref 0–5)

## 2021-06-16 LAB — PREGNANCY, URINE: Preg Test, Ur: NEGATIVE

## 2021-06-16 MED ORDER — DOXYCYCLINE HYCLATE 100 MG PO CAPS: 100.0000 mg | ORAL_CAPSULE | Freq: Two times a day (BID) | ORAL | 0 refills | Status: AC

## 2021-06-16 MED ORDER — POTASSIUM CHLORIDE CRYS ER 20 MEQ PO TBCR
40.0000 meq | EXTENDED_RELEASE_TABLET | Freq: Once | ORAL | Status: AC
Start: 2021-06-16 — End: 2021-06-16
  Administered 2021-06-16: 40 meq via ORAL
  Filled 2021-06-16: qty 2

## 2021-06-16 MED ORDER — SODIUM CHLORIDE 0.9 % IV BOLUS
1000.0000 mL | Freq: Once | INTRAVENOUS | Status: AC
  Administered 2021-06-16: 1000 mL via INTRAVENOUS

## 2021-06-16 MED ORDER — OXYCODONE-ACETAMINOPHEN 5-325 MG PO TABS
1.0000 | ORAL_TABLET | Freq: Once | ORAL | Status: AC
  Administered 2021-06-16: 1 via ORAL
  Filled 2021-06-16: qty 1

## 2021-06-16 MED ORDER — IOHEXOL 350 MG/ML SOLN
100.0000 mL | Freq: Once | INTRAVENOUS | Status: AC | PRN
Start: 2021-06-16 — End: 2021-06-16
  Administered 2021-06-16: 100 mL via INTRAVENOUS

## 2021-06-16 NOTE — ED Notes (Signed)
To CT

## 2021-06-16 NOTE — ED Notes (Signed)
Pt was discharged before shift change. Unaware of discharge condition or teaching.

## 2021-06-16 NOTE — Discharge Instructions (Signed)
You are found to have a pneumonia on your x-ray today.  As we discussed it is somewhat unusual in appearance and since you are not having any coughing I think that it is very important to have you reevaluated in the next week or 2 by a pulmonologist.  I have given you the information for pulmonologist associated with our emergency room.  Please call today to make an appointment.  Please take antibiotics for the full course.

## 2021-06-16 NOTE — ED Provider Notes (Signed)
[facility] EMERGENCY DEPARTMENT Provider Note   CSN: [id number] Arrival date & time: 06/16/21  1157     History  Chief Complaint  Patient presents with   Chest Pain    Patricia Logan is a 31 y.o. female.   Chest Pain Patient is a 31 year old female with past medical history significant for hypertension thought to perhaps have pulmonary hypertension by her cardiologist however she was lost to follow-up before her echocardiogram and additional testing could be done.  She presented to the emergency room with CP + some mild SOB since last night when she was sitting watching TV. No NV. Pain is left upper chest rads to back and is pleuritic.   No recent surgeries, hospitalization, long travel, hemoptysis, estrogen containing OCP, cancer history.  No unilateral leg swelling.  No history of PE or VTE.    Coughing more recently but put on prednisone last week and stopped coughing.       Home Medications Prior to Admission medications   Medication Sig Start Date End Date Taking? Authorizing Provider  doxycycline (VIBRAMYCIN) 100 MG capsule Take 1 capsule (100 mg total) by mouth 2 (two) times daily for 7 days. 06/16/21 06/23/21 Yes Leslieann Whisman, Kathleene Hazel, PA  acyclovir (ZOVIRAX) 400 MG tablet Take 1 tablet (400 mg total) by mouth 3 (three) times daily. 11/26/15   Focht, Fraser Din, PA  amLODipine (NORVASC) 5 MG tablet Take 1 tablet by mouth daily. 06/10/20   [provider]  cyclobenzaprine (FLEXERIL) 10 MG tablet Take 1 tablet (10 mg total) by mouth 2 (two) times daily as needed for muscle spasms. 06/16/20   Tegeler, Gwenyth Allegra, MD  furosemide (LASIX) 20 MG tablet Take 20 mg by mouth.    [provider]  losartan-hydrochlorothiazide (HYZAAR) 50-12.5 MG tablet Take 1 tablet by mouth daily. 06/10/20   [provider]      Allergies    Patient has no known allergies.    Review of Systems   Review of Systems  Cardiovascular:  Positive for chest pain.     Physical Exam Updated Vital Signs BP 139/73   Pulse 91   Temp 98.6 F (37 C) (Oral)   Resp (!) 22   Ht 5\' 11"  (1.803 m)   Wt 122.5 kg   LMP 06/02/2021 (Approximate)   SpO2 96%   BMI 37.66 kg/m  Physical Exam Vitals and nursing note reviewed.  Constitutional:      General: She is not in acute distress.    Appearance: She is obese.  HENT:     Head: Normocephalic and atraumatic.     Nose: Nose normal.  Eyes:     General: No scleral icterus. Cardiovascular:     Rate and Rhythm: Regular rhythm. Tachycardia present.     Pulses: Normal pulses.     Heart sounds: Normal heart sounds.     Comments: Heart rate of 105 Pulmonary:     Effort: Pulmonary effort is normal. No respiratory distress.     Breath sounds: No wheezing.  Abdominal:     Palpations: Abdomen is soft.     Tenderness: There is no abdominal tenderness.  Musculoskeletal:     Cervical back: Normal range of motion.     Right lower leg: No edema.     Left lower leg: No edema.     Comments: No lower extremity edema or calf tenderness  Skin:    General: Skin is warm and dry.     Capillary Refill: Capillary refill  takes less than 2 seconds.  Neurological:     Mental Status: She is alert. Mental status is at baseline.  Psychiatric:        Mood and Affect: Mood normal.        Behavior: Behavior normal.    ED Results / Procedures / Treatments   Labs (all labs ordered are listed, but only abnormal results are displayed) Labs Reviewed  BASIC METABOLIC PANEL - Abnormal; Notable for the following components:      Result Value   Potassium 3.2 (*)    Chloride 97 (*)    Glucose, Bld 101 (*)    All other components within normal limits  CBC - Abnormal; Notable for the following components:   WBC 15.6 (*)    Hemoglobin 11.6 (*)    All other components within normal limits  URINALYSIS, ROUTINE W REFLEX MICROSCOPIC - Abnormal; Notable for the following components:   Hgb urine dipstick LARGE (*)    All other  components within normal limits  URINALYSIS, MICROSCOPIC (REFLEX) - Abnormal; Notable for the following components:   Bacteria, UA RARE (*)    All other components within normal limits  PREGNANCY, URINE  TROPONIN I (HIGH SENSITIVITY)  TROPONIN I (HIGH SENSITIVITY)    EKG None  Radiology CT Angio Chest PE W/Cm &/Or Wo Cm  Result Date: 06/16/2021 CLINICAL DATA:  Tachycardia, left chest pain EXAM: CT ANGIOGRAPHY CHEST WITH CONTRAST TECHNIQUE: Multidetector CT imaging of the chest was performed using the standard protocol during bolus administration of intravenous contrast. Multiplanar CT image reconstructions and MIPs were obtained to evaluate the vascular anatomy. RADIATION DOSE REDUCTION: This exam was performed according to the departmental dose-optimization program which includes automated exposure control, adjustment of the mA and/or kV according to patient size and/or use of iterative reconstruction technique. CONTRAST:  151mL OMNIPAQUE IOHEXOL 350 MG/ML SOLN COMPARISON:  CT abdomen 05/07/2014 FINDINGS: Cardiovascular: Heart size normal. No pericardial effusion. The RV is nondilated. Satisfactory opacification of pulmonary arteries noted, and there is no evidence of pulmonary emboli. Adequate contrast opacification of the thoracic aorta with no evidence of dissection, aneurysm, or stenosis. There is classic 3-vessel brachiocephalic arch anatomy without proximal stenosis. Mediastinum/Nodes: Subcentimeter prevascular lymph nodes. No mediastinal mass. No hilar adenopathy. Lungs/Pleura: Dense consolidation in the apical segment left upper lobe extending to the hilum, with cut off of the bronchus proximally. Right lung is clear. Trace left pleural effusion. No pneumothorax. Upper Abdomen: 2 cm 20 HU left adrenal nodule, showing slow enlargement since 05/07/2014. No acute findings. Musculoskeletal: No chest wall abnormality. No acute or significant osseous findings. Review of the MIP images confirms the  above findings. IMPRESSION: 1. Negative for acute PE or thoracic aortic dissection. 2. Right upper lobe apical consolidation, with central cut off of the segmental bronchus, possibly pneumonia but cannot exclude obstructing lesion. Consider pulmonary consultation. 3. 2 cm left adrenal mass, probable benign adenoma given the slow growth over 7 years. Electronically Signed   By: Lucrezia Europe M.D.   On: 06/16/2021 16:56   DG Chest 2 View  Result Date: 06/16/2021 CLINICAL DATA:  chest pain/sob EXAM: CHEST - 2 VIEW COMPARISON:  April 18, 2021 FINDINGS: The heart size and mediastinal contours are within normal limits. There is opacity seen at the left upper lobe and is new. Low lung volumes. The visualized skeletal structures are unremarkable. IMPRESSION: There is moderate-sized opacity seen in the left upper lobe and is new, suspicious for lobar pneumonia. Remainder of the lung fields are  clear. Electronically Signed   By: Marjo Bicker M.D.   On: 06/16/2021 12:24    Procedures Procedures    Medications Ordered in ED Medications  potassium chloride SA (KLOR-CON M) CR tablet 40 mEq (40 mEq Oral Given 06/16/21 1617)  oxyCODONE-acetaminophen (PERCOCET/ROXICET) 5-325 MG per tablet 1 tablet (1 tablet Oral Given 06/16/21 1617)  sodium chloride 0.9 % bolus 1,000 mL (1,000 mLs Intravenous New Bag/Given 06/16/21 1601)  iohexol (OMNIPAQUE) 350 MG/ML injection 100 mL (100 mLs Intravenous Contrast Given 06/16/21 1623)    ED Course/ Medical Decision Making/ A&P Clinical Course as of 06/16/21 1901  Thu Jun 16, 2021  1545 CP + some mild SOB since last night when she was sitting watching TV. No NV. Pain is left upper chest rads to back and is pleuritic.   No recent surgeries, hospitalization, long travel, hemoptysis, estrogen containing OCP, cancer history.  No unilateral leg swelling.  No history of PE or VTE.    Coughing more recently but put on prednisone last week and stopped coughing.  [WF]  1707  IMPRESSION: 1. Negative for acute PE or thoracic aortic dissection. 2. Right upper lobe apical consolidation, with central cut off of the segmental bronchus, possibly pneumonia but cannot exclude obstructing lesion. Consider pulmonary consultation. 3. 2 cm left adrenal mass, probable benign adenoma given the slow growth over 7 years.   Electronically Signed   By: Corlis Leak M.D.   On: 06/16/2021 16:56 [WF]    Clinical Course User Index [WF] Gailen Shelter, PA                           Medical Decision Making Amount and/or Complexity of Data Reviewed Labs: ordered. Radiology: ordered.  Risk Prescription drug management.    This patient presents to the ED for concern of chest pain shortness of breath, this involves a number of treatment options, and is a complaint that carries with it a moderate to high risk of complications and morbidity.  The differential diagnosis includes   The emergent causes of chest pain include: Acute coronary syndrome, tamponade, pericarditis/myocarditis, aortic dissection, pulmonary embolism, tension pneumothorax, pneumonia, and esophageal rupture.    Co morbidities: Discussed in HPI   Brief History:  Patient is a 31 year old female with past medical history significant for hypertension thought to perhaps have pulmonary hypertension by her cardiologist however she was lost to follow-up before her echocardiogram and additional testing could be done.  She presented to the emergency room with CP + some mild SOB since last night when she was sitting watching TV. No NV. Pain is left upper chest rads to back and is pleuritic.   No recent surgeries, hospitalization, long travel, hemoptysis, estrogen containing OCP, cancer history.  No unilateral leg swelling.  No history of PE or VTE.    Coughing more recently but put on prednisone last week and stopped coughing.     Physical exam relatively unremarkable apart from mild tachycardia.    EMR  reviewed including pt PMHx, past surgical history and past visits to ER.   See HPI for more details   Lab Tests:   I ordered and independently interpreted labs. Labs notable for trop x2 WNLs, UA unremarkable. Urin preg negative.  Mild hypokalemia 3.2 repleted here.  CBC with leukocytosis of 15.6 mild anemia   Imaging Studies:  Abnormal findings. I personally reviewed all imaging studies. Imaging notable for  Chest x-ray with right upper lobe consolidation query  pneumonia  Patient is obese, mildly tachycardic and experiencing some shortness of breath will obtain CT PE study  CT PE study negative for PE.  Right upper lobe apical consolidation.  Pneumonia versus tumor  Cardiac Monitoring:  The patient was maintained on a cardiac monitor.  I personally viewed and interpreted the cardiac monitored which showed an underlying rhythm of: NSR EKG non-ischemic    Medicines ordered:  I ordered medication including percocet, K chlor, IVF 1L  for pain, hypoK and hydration Reevaluation of the patient after these medicines showed that the patient improved I have reviewed the patients home medicines and have made adjustments as needed   Critical Interventions:     Consults/Attending Physician   I discussed this case with my attending physician who cosigned this note including patient's presenting symptoms, physical exam, and planned diagnostics and interventions. Attending physician stated agreement with plan or made changes to plan which were implemented.   Reevaluation:  After the interventions noted above I re-evaluated patient and found that they have :improved   Social Determinants of Health:      Problem List / ED Course:  Chest pain significantly improved at this time.  CT PE study negative for blood clot but does show concern for pneumonia versus malignancy.  I have very lengthy discussion with the patient about the possibility of malignancy.  We will go ahead and  treat with a course of doxycycline given that she has had a cough although it has seemed to have improved somewhat I do have some concern for pneumonia however she understands the importance of very close follow-up with pulmonology who I have given her information for. Hypokalemia repleted here will have rechecked with PCP.   Dispostion:  After consideration of the diagnostic results and the patients response to treatment, I feel that the patent would benefit from outpatient follow-up.  Specifically very close follow-up.  Outpatient antibiotic treatment and very strict return precautions.   Final Clinical Impression(s) / ED Diagnoses Final diagnoses:  Other chest pain  Abnormal chest CT    Rx / DC Orders ED Discharge Orders          Ordered    doxycycline (VIBRAMYCIN) 100 MG capsule  2 times daily        06/16/21 1810              Gailen Shelter, [place] 06/16/21 2106    Tanda Rockers A, DO 06/18/21 0126

## 2021-06-16 NOTE — ED Triage Notes (Signed)
C/o midsternal chest pain radiating into back since last night.

## 2021-06-16 NOTE — ED Notes (Signed)
Ambulated SpO2 96-100%, HR 95-105, denies SHOB/DOE. Chest pain remains 7. Complaints of mild dizziness upon standing

## 2021-06-20 ENCOUNTER — Emergency Department (HOSPITAL_COMMUNITY)
Admission: EM | Admit: 2021-06-20 | Discharge: 2021-06-20 | Disposition: A | Discharge status: Home / Self Care | Payer: 59 | Attending: Emergency Medicine | Admitting: Emergency Medicine

## 2021-06-20 ENCOUNTER — Encounter (HOSPITAL_COMMUNITY): Payer: Self-pay

## 2021-06-20 ENCOUNTER — Emergency Department (HOSPITAL_COMMUNITY): Payer: 59

## 2021-06-20 ENCOUNTER — Other Ambulatory Visit: Payer: Self-pay

## 2021-06-20 DIAGNOSIS — R1012 Left upper quadrant pain: Secondary | ICD-10-CM | POA: Diagnosis not present

## 2021-06-20 DIAGNOSIS — I10 Essential (primary) hypertension: Secondary | ICD-10-CM | POA: Insufficient documentation

## 2021-06-20 DIAGNOSIS — D649 Anemia, unspecified: Secondary | ICD-10-CM | POA: Diagnosis not present

## 2021-06-20 DIAGNOSIS — R0789 Other chest pain: Secondary | ICD-10-CM | POA: Insufficient documentation

## 2021-06-20 DIAGNOSIS — D72829 Elevated white blood cell count, unspecified: Secondary | ICD-10-CM | POA: Insufficient documentation

## 2021-06-20 DIAGNOSIS — R079 Chest pain, unspecified: Secondary | ICD-10-CM

## 2021-06-20 LAB — URINALYSIS, ROUTINE W REFLEX MICROSCOPIC
Bacteria, UA: NONE SEEN
Bilirubin Urine: NEGATIVE
Glucose, UA: NEGATIVE mg/dL
Hgb urine dipstick: NEGATIVE
Ketones, ur: 20 mg/dL — AB
Leukocytes,Ua: NEGATIVE
Nitrite: NEGATIVE
Protein, ur: 30 mg/dL — AB
Specific Gravity, Urine: 1.026 (ref 1.005–1.030)
pH: 5 (ref 5.0–8.0)

## 2021-06-20 LAB — BASIC METABOLIC PANEL
Anion gap: 9 (ref 5–15)
BUN: 11 mg/dL (ref 6–20)
CO2: 28 mmol/L (ref 22–32)
Calcium: 9.9 mg/dL (ref 8.9–10.3)
Chloride: 105 mmol/L (ref 98–111)
Creatinine, Ser: 0.8 mg/dL (ref 0.44–1.00)
GFR, Estimated: >60 mL/min (ref >60)
Glucose, Bld: 145 mg/dL — ABNORMAL HIGH (ref 70–99)
Potassium: 3.6 mmol/L (ref 3.5–5.1)
Sodium: 142 mmol/L (ref 135–145)

## 2021-06-20 LAB — CBC
HCT: 35.5 % — ABNORMAL LOW (ref 36.0–46.0)
Hemoglobin: 11 g/dL — ABNORMAL LOW (ref 12.0–15.0)
MCH: 26 pg (ref 26.0–34.0)
MCHC: 31 g/dL (ref 30.0–36.0)
MCV: 83.9 fL (ref 80.0–100.0)
Platelets: 412 10*3/uL — ABNORMAL HIGH (ref 150–400)
RBC: 4.23 MIL/uL (ref 3.87–5.11)
RDW: 15 % (ref 11.5–15.5)
WBC: 15.1 10*3/uL — ABNORMAL HIGH (ref 4.0–10.5)
nRBC: 0 % (ref 0.0–0.2)

## 2021-06-20 LAB — I-STAT BETA HCG BLOOD, ED (MC, WL, AP ONLY): I-stat hCG, quantitative: 6.7 m[IU]/mL — ABNORMAL HIGH (ref <5)

## 2021-06-20 LAB — PREGNANCY, URINE: Preg Test, Ur: NEGATIVE

## 2021-06-20 LAB — TROPONIN I (HIGH SENSITIVITY)
Troponin I (High Sensitivity): 2 ng/L (ref <18)
Troponin I (High Sensitivity): 2 ng/L (ref <18)

## 2021-06-20 MED ORDER — IPRATROPIUM-ALBUTEROL 0.5-2.5 (3) MG/3ML IN SOLN
3.0000 mL | Freq: Once | RESPIRATORY_TRACT | Status: AC
  Administered 2021-06-20: 3 mL via RESPIRATORY_TRACT
  Filled 2021-06-20: qty 3

## 2021-06-20 MED ORDER — KETOROLAC TROMETHAMINE 30 MG/ML IJ SOLN
30.0000 mg | Freq: Once | INTRAMUSCULAR | Status: AC
  Administered 2021-06-20: 30 mg via INTRAVENOUS
  Filled 2021-06-20: qty 1

## 2021-06-20 NOTE — ED Notes (Signed)
I provided reinforced discharge education based off of discharge instructions. Pt acknowledged and understood my education. Pt had no further questions/concerns for provider/myself.  °

## 2021-06-20 NOTE — ED Notes (Signed)
Lab is adding on pregnancy urine

## 2021-06-20 NOTE — ED Triage Notes (Signed)
Patient BIB [facility] from home. Upper left flank pain, worsens on movement/palpitation. Hurts to walk. Pain started 1 hour ago while she was laying in bed.

## 2021-06-20 NOTE — Discharge Instructions (Signed)
You were seen in the emergency room today with chest pain.  Please call the pulmonary doctors for follow-up.  I would like for you to continue your antibiotics.  Return with any new or suddenly worsening symptoms.

## 2021-06-20 NOTE — ED Provider Notes (Signed)
Emergency Department Provider Note   I have reviewed the triage vital signs and the nursing notes.   HISTORY  Chief Complaint Flank Pain   HPI Patricia Logan is a 31 y.o. female with past history of hypertension presents emergency department with what she describes to me is a central chest discomfort which is sharp radiating to the left chest.  She reported in triage or the left upper flank pain but this is more chest.  She is not feeling particularly short of breath.  She was evaluated at an outside emergency department on June 15 for similar pain which included lab work and CTA of the chest which did not show a PE.  She is being treated for pneumonia/infiltrate found on that CT.  She is been compliant with doxycycline as well as albuterol but has not noticed much change.  No fevers.  No cough.  No hemoptysis.  She is not having vomiting or diarrhea.  No lower abdominal pain.  No UTI symptoms.  Symptoms returned this evening while lying in bed without exertion and prompted her to return for evaluation.  Past Medical History:  Diagnosis Date   Hypertension     Review of Systems  Constitutional: No fever/chills Eyes: No visual changes. ENT: No sore throat. Cardiovascular: Positive chest pain. Respiratory: Denies shortness of breath. Gastrointestinal: No abdominal pain.  No nausea, no vomiting.  No diarrhea.  No constipation. Genitourinary: Negative for dysuria. Musculoskeletal: Negative for back pain. Skin: Negative for rash. Neurological: Negative for headaches, focal weakness or numbness.  ____________________________________________   PHYSICAL EXAM:  VITAL SIGNS: ED Triage Vitals  Enc Vitals Group     BP 06/20/21 0038 (!) 182/96     Pulse Rate 06/20/21 0038 (!) 117     Resp 06/20/21 0038 16     Temp 06/20/21 0038 98.7 F (37.1 C)     Temp Source 06/20/21 0038 Oral     SpO2 06/20/21 0038 100 %   Constitutional: Alert and oriented. Well appearing and in no acute  distress. Eyes: Conjunctivae are normal.  Head: Atraumatic. Nose: No congestion/rhinnorhea. Mouth/Throat: Mucous membranes are moist.   Neck: No stridor.   Cardiovascular: Normal rate, regular rhythm. Good peripheral circulation. Grossly normal heart sounds.   Respiratory: Normal respiratory effort.  No retractions. Lungs CTAB. Gastrointestinal: Soft and nontender. No distention.  Musculoskeletal: No lower extremity tenderness nor edema. No gross deformities of extremities.  Tenderness to the anterior chest and left sternum which reproduces patient's discomfort. Neurologic:  Normal speech and language. No gross focal neurologic deficits are appreciated.  Skin:  Skin is warm, dry and intact. No rash noted.   ____________________________________________   LABS (all labs ordered are listed, but only abnormal results are displayed)  Labs Reviewed  URINALYSIS, ROUTINE W REFLEX MICROSCOPIC - Abnormal; Notable for the following components:      Result Value   Ketones, ur 20 (*)    Protein, ur 30 (*)    All other components within normal limits  BASIC METABOLIC PANEL - Abnormal; Notable for the following components:   Glucose, Bld 145 (*)    All other components within normal limits  CBC - Abnormal; Notable for the following components:   WBC 15.1 (*)    Hemoglobin 11.0 (*)    HCT 35.5 (*)    Platelets 412 (*)    All other components within normal limits  I-STAT BETA HCG BLOOD, ED ([facility], [facility], [facility] ONLY) - Abnormal; Notable for the following components:  I-stat hCG, quantitative 6.7 (*)    All other components within normal limits  PREGNANCY, URINE  TROPONIN I (HIGH SENSITIVITY)  TROPONIN I (HIGH SENSITIVITY)   ____________________________________________  EKG   EKG Interpretation  Date/Time:  Monday June 20 2021 04:28:17 EDT Ventricular Rate:  86 PR Interval:  175 QRS Duration: 88 QT Interval:  359 QTC Calculation: 430 R Axis:   43 Text Interpretation: Sinus rhythm  Borderline repolarization abnormality Similar to June 15th tracing Confirmed by Nanda Quinton [phone]) on 06/20/2021 4:32:49 AM        ____________________________________________  RADIOLOGY  DG Chest 2 View  Result Date: 06/20/2021 CLINICAL DATA:  Chest pain. EXAM: CHEST - 2 VIEW COMPARISON:  06/16/2021 FINDINGS: Right lung clear. Persistent focal opacity in the left upper lobe, better characterized on CTA chest 06/16/2021. No pleural effusion. The cardiopericardial silhouette is within normal limits for size. The visualized bony structures of the thorax are unremarkable. Telemetry leads overlie the chest. IMPRESSION: Persistent left upper lobe opacity, better characterized on CTA chest 06/16/2021. No new findings. Electronically Signed   By: Misty Stanley M.D.   On: 06/20/2021 06:30    ____________________________________________   PROCEDURES  Procedure(s) performed:   Procedures  None  ____________________________________________   INITIAL IMPRESSION / ASSESSMENT AND PLAN / ED COURSE  Pertinent labs & imaging results that were available during my care of the patient were reviewed by me and considered in my medical decision making (see chart for details).   This patient is Presenting for Evaluation of CP, which does require a range of treatment options, and is a complaint that involves a high risk of morbidity and mortality.  The Differential Diagnoses includes but is not exclusive to acute coronary syndrome, aortic dissection, pulmonary embolism, cardiac tamponade, community-acquired pneumonia, pericarditis, musculoskeletal chest wall pain, etc.   Critical Interventions-    Medications  ketorolac (TORADOL) 30 MG/ML injection 30 mg (30 mg Intravenous Given 06/20/21 0447)  ipratropium-albuterol (DUONEB) 0.5-2.5 (3) MG/3ML nebulizer solution 3 mL (3 mLs Nebulization Given 06/20/21 0438)    Reassessment after intervention: Symptoms improved.    I decided to review pertinent  External Data, and in summary reviewed ED visit and CTA PE study from 6/15.   Clinical Laboratory Tests Ordered, included urine pregnancy negative.  No evidence of urinary tract infection and her hemoglobin suspect kidney stone.  No acute kidney injury.  Patient does have leukocytosis to 15 and mild anemia at 11.  Radiologic Tests Ordered, included CXR. I independently interpreted the images and agree with radiology interpretation.   Cardiac Monitor Tracing which shows NSR. No ectopy. Tachycardia on arrival resolved.    Social Determinants of Health Risk patient with a smoking history.   Medical Decision Making: Summary:  Patient presents emergency department left chest pain radiating around to the back.  No appreciable abdominal tenderness.  Patient for me describing more of a chest discomfort.  May be musculoskeletal as some component of this does seem reproducible.  She had a CTA of the chest several days ago which was negative and prompted treatment with antibiotic.  She does have mild leukocytosis here but no hypoxemia.  Plan for repeat chest x-ray but will hold on bland/CT imaging of the abdomen/pelvis or repeat CT imaging of the chest.  Reevaluation with update and discussion with patient.  Chest x-ray shows persistent area in the left upper lobe.  She will continue antibiotics but plan is for her to call pulmonology today and schedule the next available follow-up appointment.  She was given contact information during the last ED visit and does have this at home.  She plans to call later today.  Considered admission but vital signs are within normal limits.  No increased work of breathing or hypoxemia.  Patient with follow-up plan to see the specialist.  Discussed ED return precautions.  Disposition: discharge  ____________________________________________  FINAL CLINICAL IMPRESSION(S) / ED DIAGNOSES  Final diagnoses:  Left-sided chest pain    Note:  This document was prepared using  Dragon voice recognition software and may include unintentional dictation errors.  Alona Bene, MD, [facility] Emergency Medicine    Pamlea Finder, Arlyss Repress, MD 06/20/21 [phone]

## 2022-06-21 ENCOUNTER — Encounter (HOSPITAL_BASED_OUTPATIENT_CLINIC_OR_DEPARTMENT_OTHER): Payer: Self-pay

## 2022-06-21 ENCOUNTER — Emergency Department (HOSPITAL_BASED_OUTPATIENT_CLINIC_OR_DEPARTMENT_OTHER)
Admission: EM | Admit: 2022-06-21 | Discharge: 2022-06-22 | Disposition: A | Discharge status: Home / Self Care | Payer: 59 | Attending: Emergency Medicine | Admitting: Emergency Medicine

## 2022-06-21 ENCOUNTER — Other Ambulatory Visit: Payer: Self-pay

## 2022-06-21 DIAGNOSIS — J45909 Unspecified asthma, uncomplicated: Secondary | ICD-10-CM | POA: Insufficient documentation

## 2022-06-21 DIAGNOSIS — Z79899 Other long term (current) drug therapy: Secondary | ICD-10-CM | POA: Diagnosis not present

## 2022-06-21 DIAGNOSIS — I10 Essential (primary) hypertension: Secondary | ICD-10-CM | POA: Insufficient documentation

## 2022-06-21 DIAGNOSIS — J189 Pneumonia, unspecified organism: Secondary | ICD-10-CM | POA: Diagnosis not present

## 2022-06-21 DIAGNOSIS — R079 Chest pain, unspecified: Secondary | ICD-10-CM | POA: Diagnosis present

## 2022-06-21 DIAGNOSIS — Z7952 Long term (current) use of systemic steroids: Secondary | ICD-10-CM | POA: Diagnosis not present

## 2022-06-21 DIAGNOSIS — R0789 Other chest pain: Secondary | ICD-10-CM

## 2022-06-21 DIAGNOSIS — Z7951 Long term (current) use of inhaled steroids: Secondary | ICD-10-CM | POA: Insufficient documentation

## 2022-06-21 NOTE — ED Triage Notes (Signed)
Pt arrives with c/o chest pain and back pain. Pt seen 2 times in the last 4 days for the same. Pt endorses SOB, sore throat, and generalized body aches.

## 2022-06-22 ENCOUNTER — Emergency Department (HOSPITAL_BASED_OUTPATIENT_CLINIC_OR_DEPARTMENT_OTHER): Payer: 59

## 2022-06-22 LAB — CBC WITH DIFFERENTIAL/PLATELET
Abs Immature Granulocytes: 0.07 10*3/uL (ref 0.00–0.07)
Basophils Absolute: 0 10*3/uL (ref 0.0–0.1)
Basophils Relative: 0 %
Eosinophils Absolute: 0.1 10*3/uL (ref 0.0–0.5)
Eosinophils Relative: 1 %
HCT: 35.7 % — ABNORMAL LOW (ref 36.0–46.0)
Hemoglobin: 11.5 g/dL — ABNORMAL LOW (ref 12.0–15.0)
Immature Granulocytes: 0 %
Lymphocytes Relative: 12 %
Lymphs Abs: 2 10*3/uL (ref 0.7–4.0)
MCH: 27.6 pg (ref 26.0–34.0)
MCHC: 32.2 g/dL (ref 30.0–36.0)
MCV: 85.8 fL (ref 80.0–100.0)
Monocytes Absolute: 1 10*3/uL (ref 0.1–1.0)
Monocytes Relative: 6 %
Neutro Abs: 13.5 10*3/uL — ABNORMAL HIGH (ref 1.7–7.7)
Neutrophils Relative %: 81 %
Platelets: 311 10*3/uL (ref 150–400)
RBC: 4.16 MIL/uL (ref 3.87–5.11)
RDW: 13.9 % (ref 11.5–15.5)
WBC: 16.7 10*3/uL — ABNORMAL HIGH (ref 4.0–10.5)
nRBC: 0 % (ref 0.0–0.2)

## 2022-06-22 LAB — BASIC METABOLIC PANEL
Anion gap: 7 (ref 5–15)
BUN: 7 mg/dL (ref 6–20)
CO2: 24 mmol/L (ref 22–32)
Calcium: 8.9 mg/dL (ref 8.9–10.3)
Chloride: 102 mmol/L (ref 98–111)
Creatinine, Ser: 0.86 mg/dL (ref 0.44–1.00)
GFR, Estimated: >60 mL/min (ref >60)
Glucose, Bld: 122 mg/dL — ABNORMAL HIGH (ref 70–99)
Potassium: 3.5 mmol/L (ref 3.5–5.1)
Sodium: 133 mmol/L — ABNORMAL LOW (ref 135–145)

## 2022-06-22 LAB — TROPONIN I (HIGH SENSITIVITY): Troponin I (High Sensitivity): <2 ng/L (ref <18)

## 2022-06-22 LAB — D-DIMER, QUANTITATIVE: D-Dimer, Quant: 0.27 ug/mL-FEU (ref 0.00–0.50)

## 2022-06-22 MED ORDER — DOXYCYCLINE HYCLATE 100 MG PO TABS
100.0000 mg | ORAL_TABLET | Freq: Once | ORAL | Status: AC
  Administered 2022-06-22: 100 mg via ORAL
  Filled 2022-06-22: qty 1

## 2022-06-22 MED ORDER — KETOROLAC TROMETHAMINE 30 MG/ML IJ SOLN
30.0000 mg | Freq: Once | INTRAMUSCULAR | Status: AC
Start: 2022-06-22 — End: 2022-06-22
  Administered 2022-06-22: 30 mg via INTRAVENOUS
  Filled 2022-06-22: qty 1

## 2022-06-22 MED ORDER — DOXYCYCLINE HYCLATE 100 MG PO CAPS: 100.0000 mg | ORAL_CAPSULE | Freq: Two times a day (BID) | ORAL | 0 refills | Status: AC

## 2022-06-22 NOTE — Discharge Instructions (Signed)
Begin taking doxycycline as prescribed.  Take ibuprofen 600 mg every 6 hours as needed for pain.  Follow-up with your primary doctor in the next week for a recheck.

## 2022-06-22 NOTE — ED Provider Notes (Signed)
[name] Provider Note   CSN: [id number] Arrival date & time: 06/21/22  2344     History  Chief Complaint  Patient presents with   Chest Pain    Patricia Logan is a 32 y.o. female.  Patient is a 32 year old female with history of asthma, hypertension.  Patient presenting today with complaints of chest pain.  This has been ongoing for the past 24 hours.  It began in the absence of any injury or trauma.  She describes a sharp pain to the center of her chest that radiates through to her back.  It is worse when she moves or takes a deep breath.  She denies any nausea, diaphoresis, or radiation to the arm or jaw.  She does feel somewhat short of breath.  There are no alleviating factors.  The history is provided by the patient.       Home Medications Prior to Admission medications   Medication Sig Start Date End Date Taking? Authorizing Provider  acyclovir (ZOVIRAX) 400 MG tablet Take 1 tablet (400 mg total) by mouth 3 (three) times daily. Patient not taking: Reported on 06/20/2021 11/26/15   Jerre Simon, PA  albuterol (VENTOLIN HFA) 108 (90 Base) MCG/ACT inhaler Inhale 2 puffs into the lungs every 4 (four) hours as needed. 06/09/21   [provider]  amLODipine (NORVASC) 5 MG tablet Take 1 tablet by mouth daily. 06/10/20   [provider]  cyclobenzaprine (FLEXERIL) 10 MG tablet Take 1 tablet (10 mg total) by mouth 2 (two) times daily as needed for muscle spasms. Patient not taking: Reported on 06/20/2021 06/16/20   Tegeler, Canary Brim, MD  losartan-hydrochlorothiazide (HYZAAR) 50-12.5 MG tablet Take 1 tablet by mouth daily. 06/10/20   [provider]  predniSONE (DELTASONE) 10 MG tablet Take 10 mg by mouth. TAKE 4 TABLETS FOR THREE DAYS, 3 TABLETS FOR THREE DAYS, 2 TABLETS FOR THREE DAYS, 1 TABLET FOR THREE DAYS, THEN 1/2 TABLET FOR THREE DAYS AND STOP Patient not taking: Reported on 06/20/2021 06/09/21    [provider]  SYMBICORT 160-4.5 MCG/ACT inhaler Inhale 2 puffs into the lungs in the morning and at bedtime. 06/09/21   [provider]      Allergies    Patient has no known allergies.    Review of Systems   Review of Systems  All other systems reviewed and are negative.   Physical Exam Updated Vital Signs BP (!) 150/89 (BP Location: Left Arm)   Pulse (!) 101   Temp 99.5 F (37.5 C)   Resp 18   Ht 5\' 11"  (1.803 m)   Wt 122.5 kg   SpO2 99%   BMI 37.66 kg/m  Physical Exam Vitals and nursing note reviewed.  Constitutional:      General: She is not in acute distress.    Appearance: She is well-developed. She is not diaphoretic.  HENT:     Head: Normocephalic and atraumatic.  Cardiovascular:     Rate and Rhythm: Normal rate and regular rhythm.     Heart sounds: No murmur heard.    No friction rub. No gallop.  Pulmonary:     Effort: Pulmonary effort is normal. No respiratory distress.     Breath sounds: Normal breath sounds. No wheezing.     Comments: There is tenderness to palpation of the anterior chest wall.  There is no crepitus.  Breath sounds are clear and equal. Abdominal:     General: Bowel sounds  are normal. There is no distension.     Palpations: Abdomen is soft.     Tenderness: There is no abdominal tenderness.  Musculoskeletal:        General: Normal range of motion.     Cervical back: Normal range of motion and neck supple.     Right lower leg: No tenderness. No edema.     Left lower leg: No tenderness. No edema.  Skin:    General: Skin is warm and dry.  Neurological:     General: No focal deficit present.     Mental Status: She is alert and oriented to person, place, and time.     ED Results / Procedures / Treatments   Labs (all labs ordered are listed, but only abnormal results are displayed) Labs Reviewed  BASIC METABOLIC PANEL  CBC WITH DIFFERENTIAL/PLATELET  D-DIMER, QUANTITATIVE  TROPONIN I (HIGH SENSITIVITY)     EKG EKG Interpretation  Date/Time:  Wednesday June 21 2022 23:55:27 EDT Ventricular Rate:  98 PR Interval:  177 QRS Duration: 95 QT Interval:  337 QTC Calculation: 431 R Axis:   47 Text Interpretation: Sinus rhythm Low voltage, precordial leads Borderline T abnormalities, diffuse leads No significant change since last tracing 06/20/2021 Confirmed by Geoffery Lyons ([id number]) on 06/22/2022 12:54:57 AM  Radiology No results found.  Procedures Procedures    Medications Ordered in ED Medications  ketorolac (TORADOL) 30 MG/ML injection 30 mg (has no administration in time range)    ED Course/ Medical Decision Making/ A&P  Patient is a 32 year old female with complaints of chest pain as described in the HPI.  She has history of pneumonia 1 year ago and this feels similar.  Patient arrives here afebrile with stable vital signs.  Physical examination basically unremarkable.  Workup initiated including CBC and basic metabolic panel.  Patient has a white count of 16.7, but laboratory studies are otherwise unremarkable.  D-dimer is negative.  Troponin is negative.  Chest x-ray shows a left upper lobe pneumonia.  This will be treated with doxycycline and patient to follow-up as needed if not improving.  Final Clinical Impression(s) / ED Diagnoses Final diagnoses:  None    Rx / DC Orders ED Discharge Orders     None         Geoffery Lyons, MD 06/22/22 [phone]

## 2022-06-25 IMAGING — CR DG CHEST 2V
2 series · 2 of 2 positions shown · non-contrast
Comparison: July 29, 2020.

CLINICAL DATA: Chest pain, shortness of breath.

EXAM:
CHEST - 2 VIEW

[w chest pa]
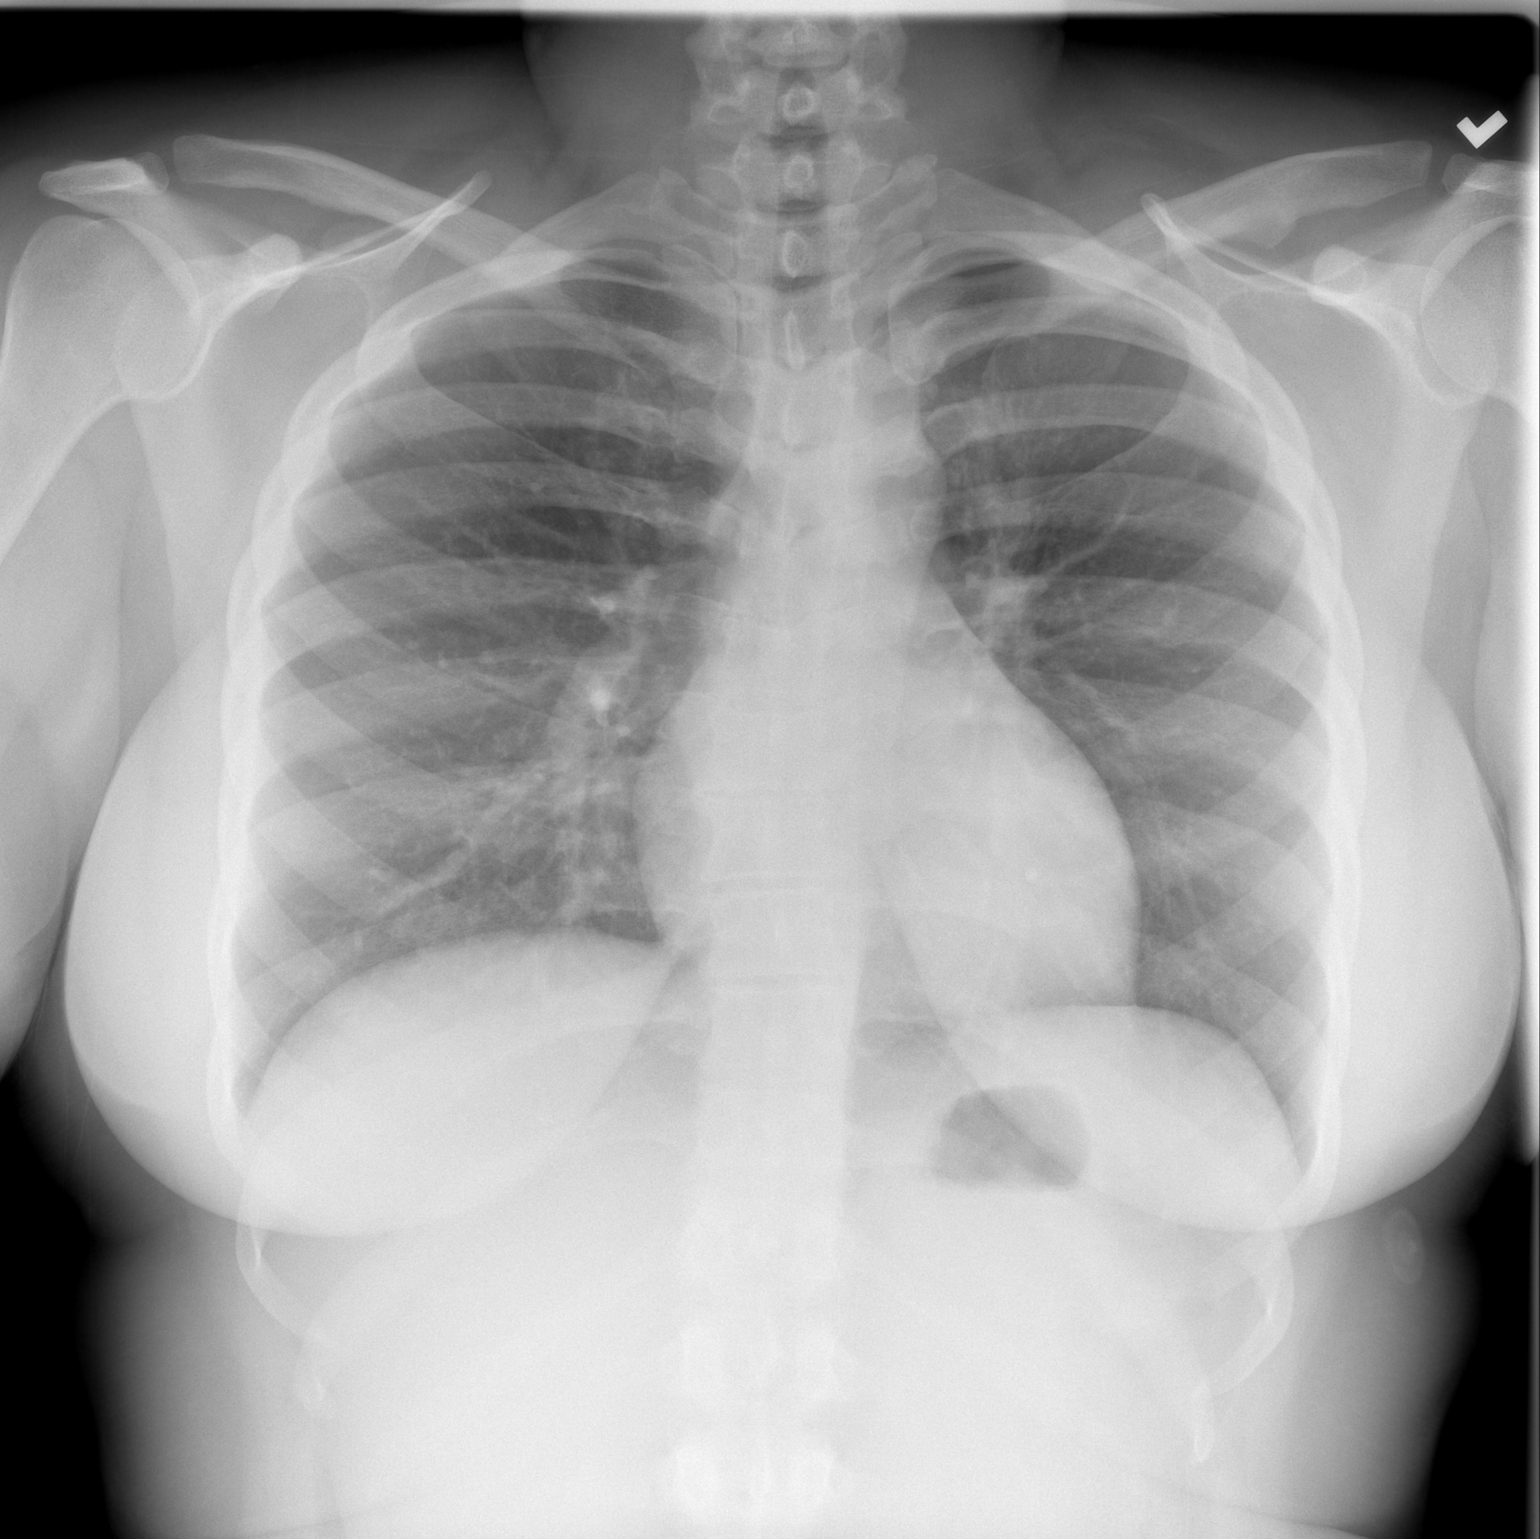

[w chest lat]
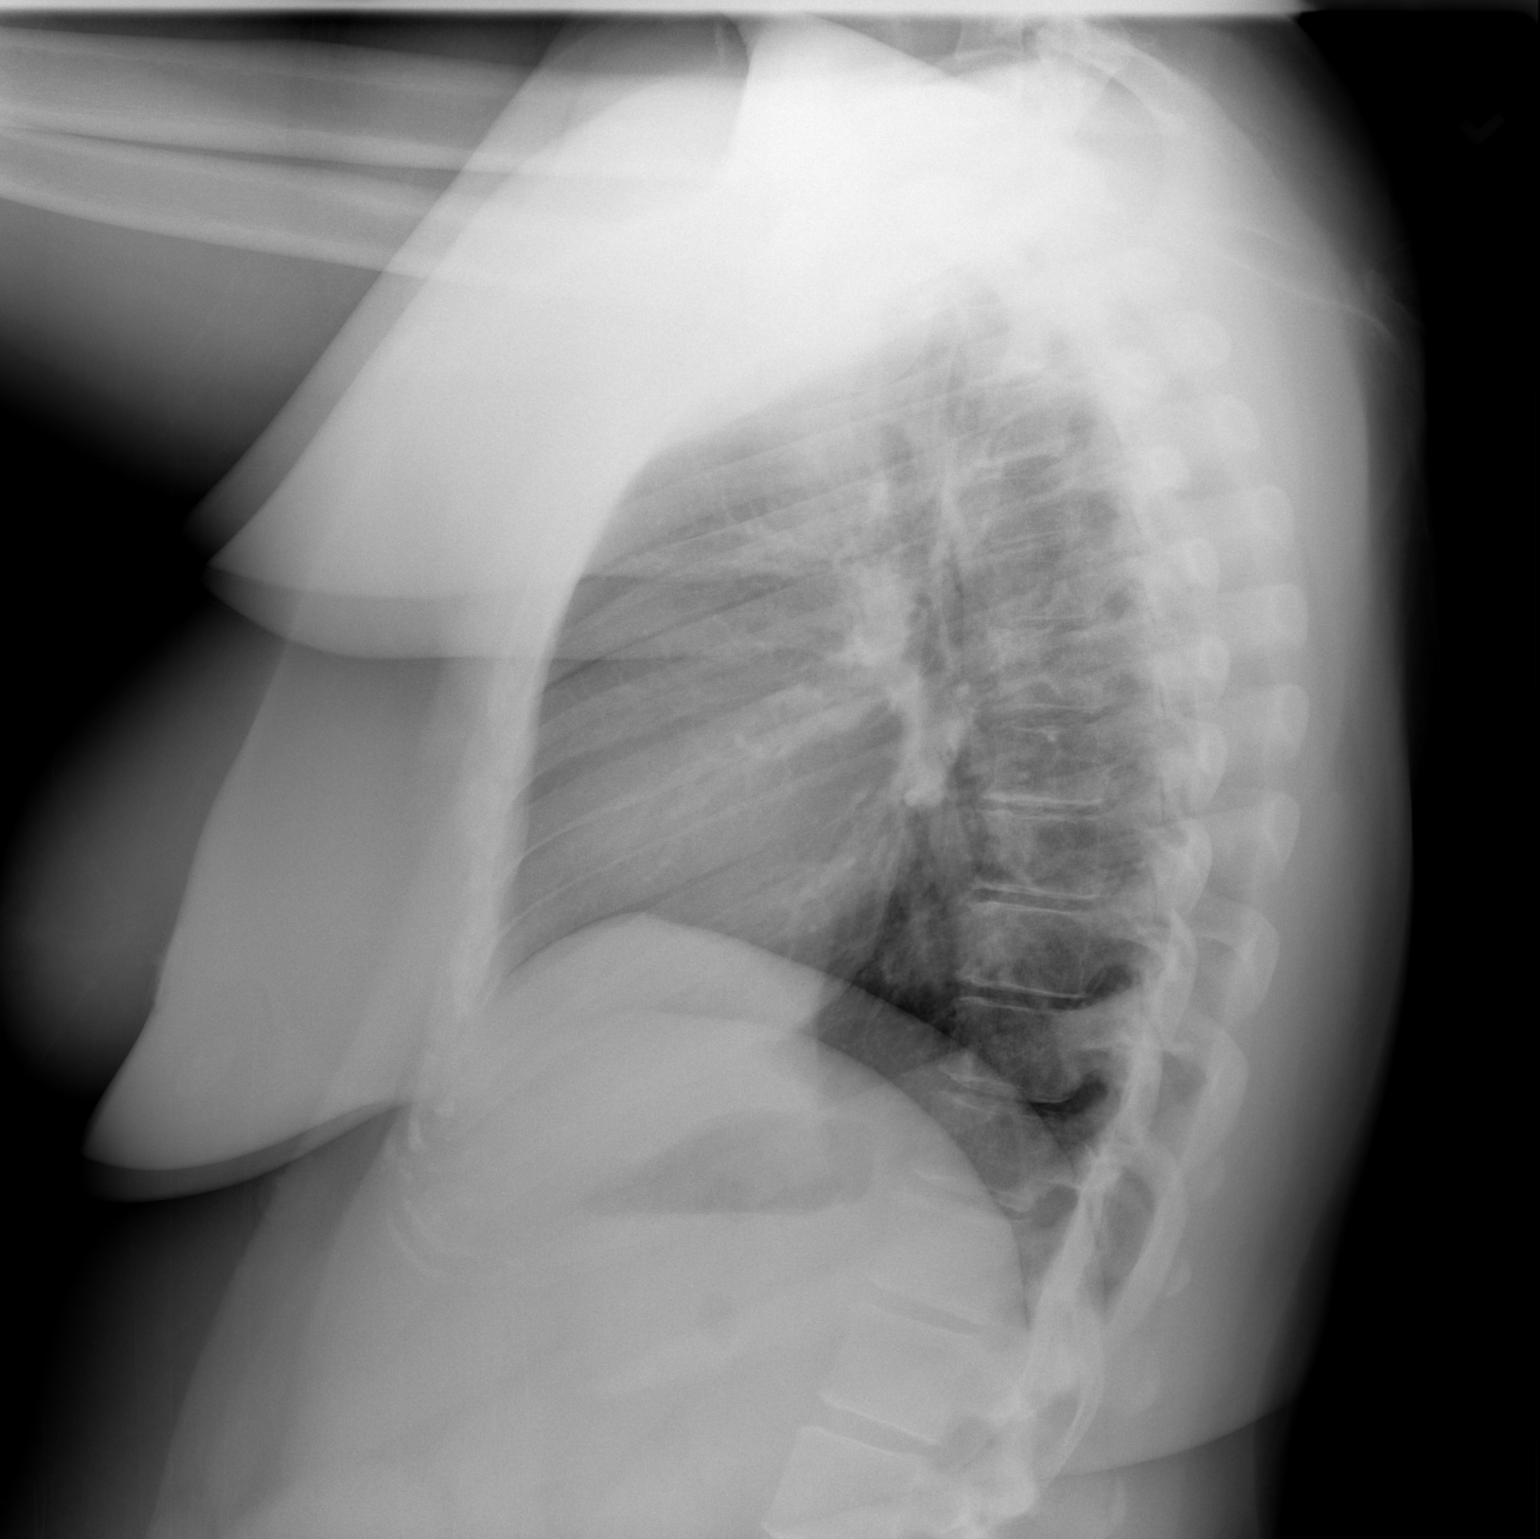

[2 of 2 positions shown; findings below may reference images not displayed]

FINDINGS: The heart size and mediastinal contours are within normal limits.
Both lungs are clear. The visualized skeletal structures are
unremarkable.
IMPRESSION: No active cardiopulmonary disease.

## 2022-08-27 IMAGING — CR DG CHEST 2V
2 series · 2 of 2 positions shown · non-contrast
Comparison: 06/16/2021

CLINICAL DATA: Chest pain.

EXAM:
CHEST - 2 VIEW

[w chest pa]
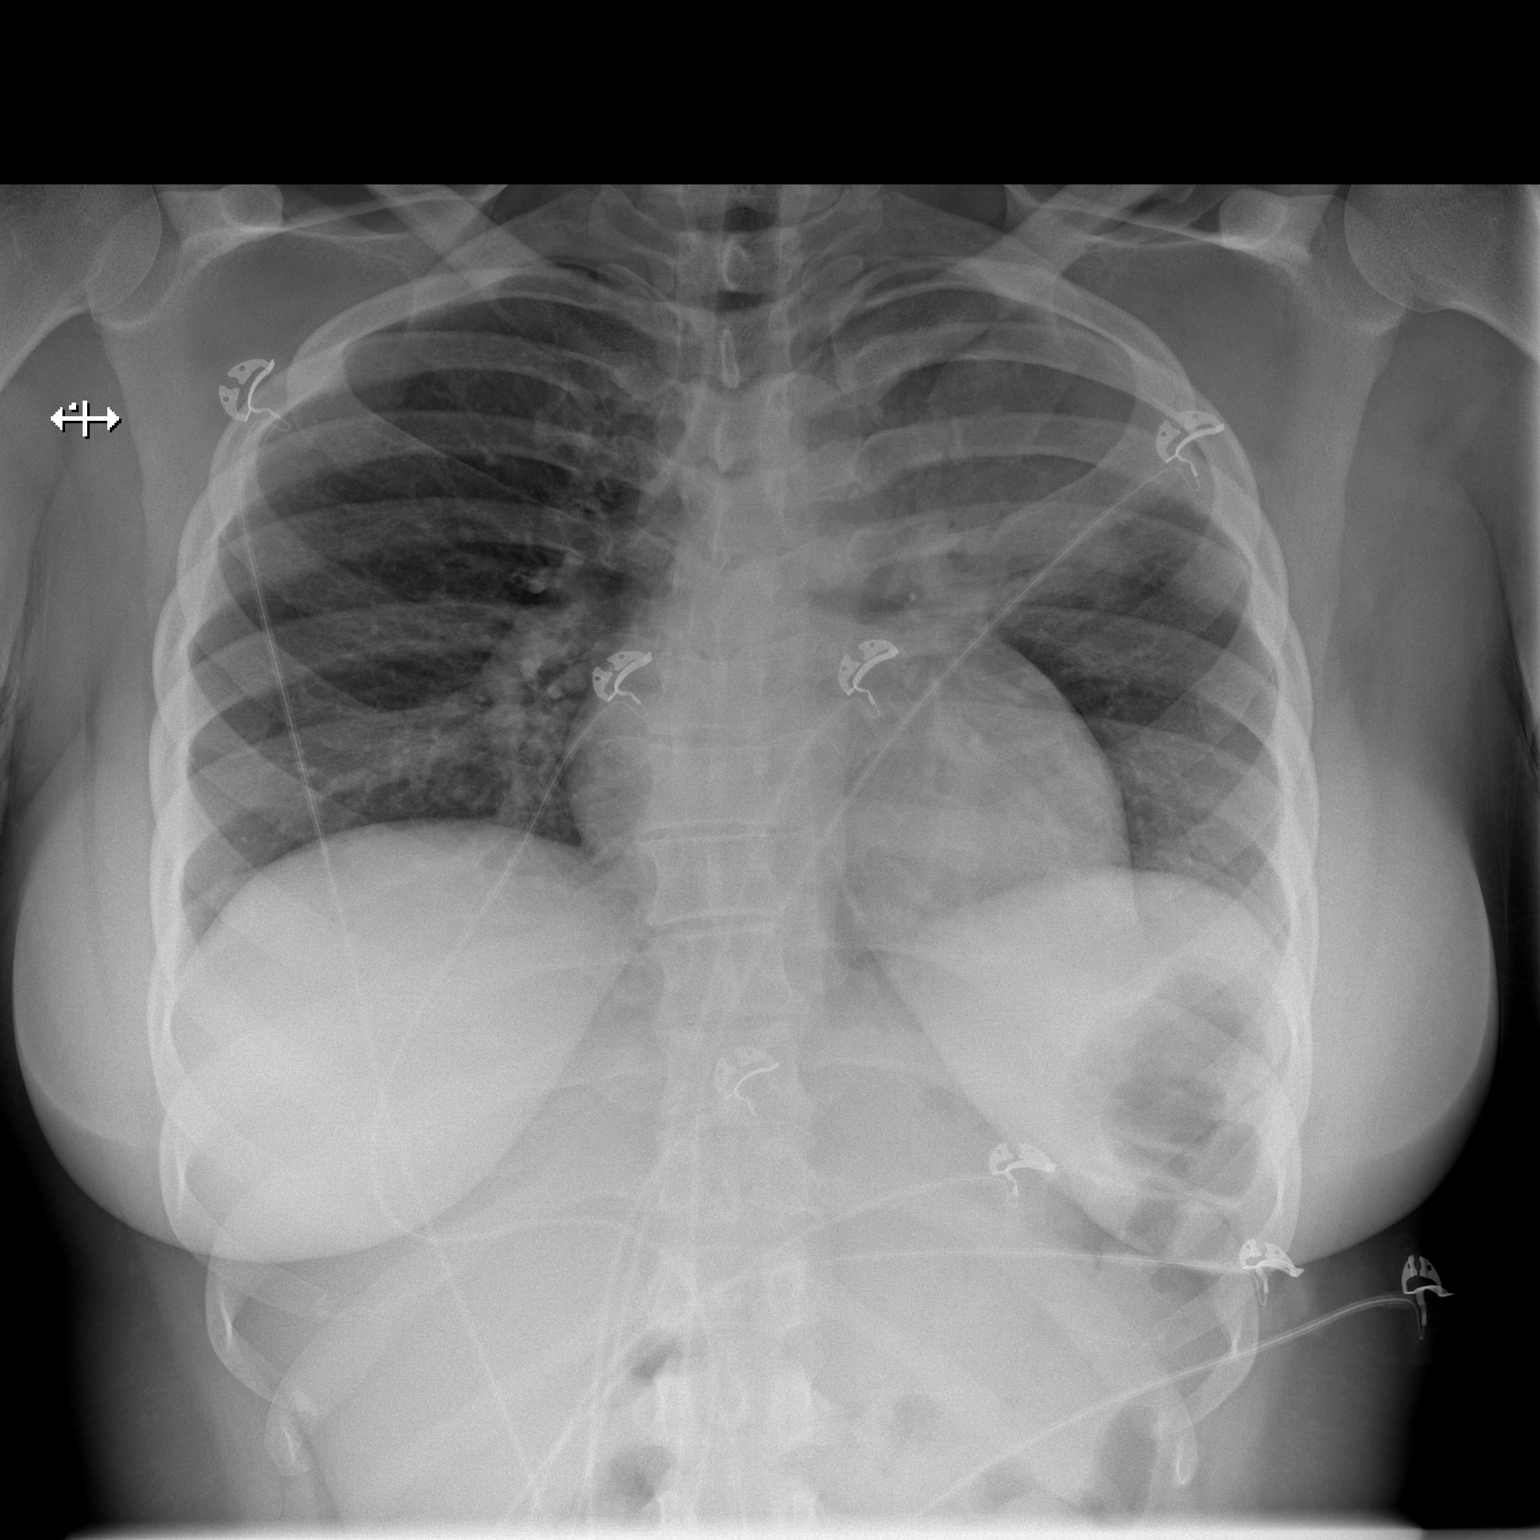

[w chest lat]
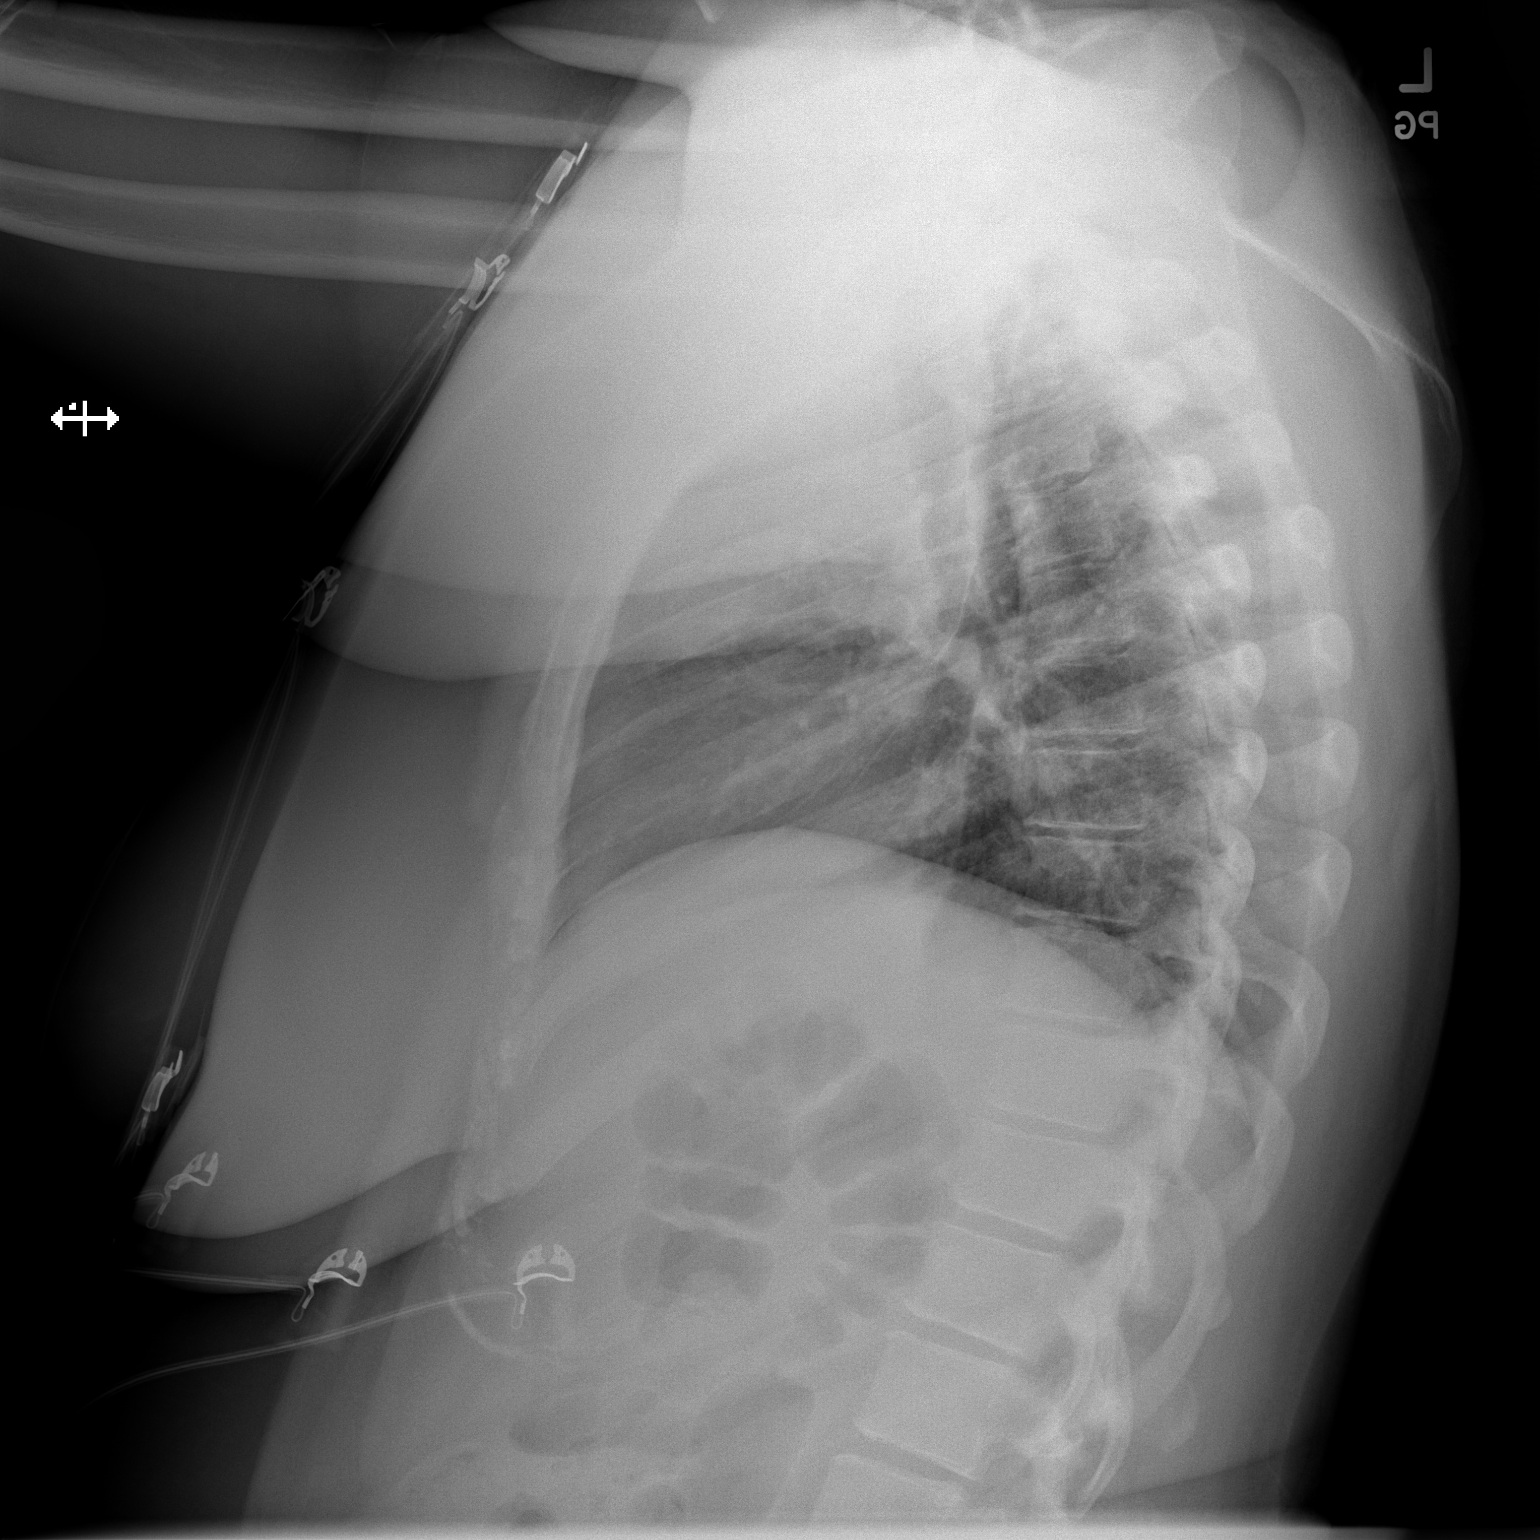

[2 of 2 positions shown; findings below may reference images not displayed]

FINDINGS: Right lung clear. Persistent focal opacity in the left upper lobe,
better characterized on CTA chest 06/16/2021. No pleural effusion.
The cardiopericardial silhouette is within normal limits for size.
The visualized bony structures of the thorax are unremarkable.
Telemetry leads overlie the chest.
IMPRESSION: Persistent left upper lobe opacity, better characterized on CTA
chest 06/16/2021. No new findings.

## 2022-10-17 ENCOUNTER — Encounter (HOSPITAL_BASED_OUTPATIENT_CLINIC_OR_DEPARTMENT_OTHER): Payer: Self-pay | Admitting: Urology

## 2022-10-17 ENCOUNTER — Other Ambulatory Visit: Payer: Self-pay

## 2022-10-17 ENCOUNTER — Emergency Department (HOSPITAL_BASED_OUTPATIENT_CLINIC_OR_DEPARTMENT_OTHER)
Admission: EM | Admit: 2022-10-17 | Discharge: 2022-10-17 | Disposition: A | Discharge status: Home / Self Care | Payer: 59 | Attending: Emergency Medicine | Admitting: Emergency Medicine

## 2022-10-17 DIAGNOSIS — R040 Epistaxis: Secondary | ICD-10-CM | POA: Insufficient documentation

## 2022-10-17 LAB — CBC WITH DIFFERENTIAL/PLATELET
Abs Immature Granulocytes: 0.02 10*3/uL (ref 0.00–0.07)
Basophils Absolute: 0 10*3/uL (ref 0.0–0.1)
Basophils Relative: 1 %
Eosinophils Absolute: 0.4 10*3/uL (ref 0.0–0.5)
Eosinophils Relative: 6 %
HCT: 36.1 % (ref 36.0–46.0)
Hemoglobin: 11.3 g/dL — ABNORMAL LOW (ref 12.0–15.0)
Immature Granulocytes: 0 %
Lymphocytes Relative: 42 %
Lymphs Abs: 2.8 10*3/uL (ref 0.7–4.0)
MCH: 26.7 pg (ref 26.0–34.0)
MCHC: 31.3 g/dL (ref 30.0–36.0)
MCV: 85.1 fL (ref 80.0–100.0)
Monocytes Absolute: 0.5 10*3/uL (ref 0.1–1.0)
Monocytes Relative: 7 %
Neutro Abs: 2.9 10*3/uL (ref 1.7–7.7)
Neutrophils Relative %: 44 %
Platelets: 324 10*3/uL (ref 150–400)
RBC: 4.24 MIL/uL (ref 3.87–5.11)
RDW: 14.6 % (ref 11.5–15.5)
WBC: 6.7 10*3/uL (ref 4.0–10.5)
nRBC: 0 % (ref 0.0–0.2)

## 2022-10-17 MED ORDER — OXYMETAZOLINE HCL 0.05 % NA SOLN
2.0000 | NASAL | Status: DC | PRN
  Administered 2022-10-17: 2 via NASAL
  Filled 2022-10-17: qty 30

## 2022-10-17 NOTE — Discharge Instructions (Signed)
You were seen in the ER today for a nosebleed. The bleeding was stopped largely on its own before you were seen, but Afrin nasal spray was used to stop the bleeding further which worked well. Your hemoglobin/hematocrit is stable. I would suggest trying to managing this at home by keeping the area humid and moist using a humidified or nasal saline gel applied directly into the nostril. I bleeding were to return, apply direct pressure to the nose and use Afrin nasal spray if bleeding is not stopped after 10-15 minutes of direct pressure. You may require evaluation with an ENT if the nosebleeds continue.

## 2022-10-17 NOTE — ED Provider Notes (Signed)
[place] [facility] Provider Note   CSN: [id number] Arrival date & time: 10/17/22  1616     History Chief Complaint  Patient presents with   Epistaxis    Patricia Logan is a 32 y.o. female. Patient presents to the ED today with concerns of nosebleed. States that she has been experiencing nosebleeds several times for the last 3 days. No recent new medications, nasal trauma, or other injury. Reports that most recent nosebleed occurred about 45 minutes prior to arriving to the ED, but largely slowed down. Denies feelings of dizziness, lightheadedness, syncope, or weakness. Denies fevers, congestion, chest pain, or shortness of breath.    Epistaxis      Home Medications Prior to Admission medications   Medication Sig Start Date End Date Taking? Authorizing Provider  acyclovir (ZOVIRAX) 400 MG tablet Take 1 tablet (400 mg total) by mouth 3 (three) times daily. Patient not taking: Reported on 06/20/2021 11/26/15   Jerre Simon, PA  albuterol (VENTOLIN HFA) 108 (90 Base) MCG/ACT inhaler Inhale 2 puffs into the lungs every 4 (four) hours as needed. 06/09/21   [provider]  amLODipine (NORVASC) 5 MG tablet Take 1 tablet by mouth daily. 06/10/20   [provider]  cyclobenzaprine (FLEXERIL) 10 MG tablet Take 1 tablet (10 mg total) by mouth 2 (two) times daily as needed for muscle spasms. Patient not taking: Reported on 06/20/2021 06/16/20   Tegeler, Canary Brim, MD  doxycycline (VIBRAMYCIN) 100 MG capsule Take 1 capsule (100 mg total) by mouth 2 (two) times daily. One po bid x 7 days 06/22/22   Geoffery Lyons, MD  losartan-hydrochlorothiazide (HYZAAR) 50-12.5 MG tablet Take 1 tablet by mouth daily. 06/10/20   [provider]  predniSONE (DELTASONE) 10 MG tablet Take 10 mg by mouth. TAKE 4 TABLETS FOR THREE DAYS, 3 TABLETS FOR THREE DAYS, 2 TABLETS FOR THREE DAYS, 1 TABLET FOR THREE DAYS, THEN 1/2 TABLET FOR THREE DAYS AND  STOP Patient not taking: Reported on 06/20/2021 06/09/21   [provider]  SYMBICORT 160-4.5 MCG/ACT inhaler Inhale 2 puffs into the lungs in the morning and at bedtime. 06/09/21   [provider]      Allergies    Patient has no known allergies.    Review of Systems   Review of Systems  HENT:  Positive for nosebleeds.   All other systems reviewed and are negative.   Physical Exam Updated Vital Signs BP (!) 146/94   Pulse 76   Temp 98.2 F (36.8 C)   Resp 18   Ht 5\' 11"  (1.803 m)   Wt 122.5 kg   SpO2 99%   BMI 37.67 kg/m  Physical Exam Vitals and nursing note reviewed.  Constitutional:      General: She is not in acute distress.    Appearance: She is well-developed.  HENT:     Head: Normocephalic and atraumatic.     Nose: Nose normal. No congestion or rhinorrhea.     Comments: Dried blood present in bilateral nares. No active hemorrhage present. Visible vasculature more prominently in left anterior septum. Eyes:     Conjunctiva/sclera: Conjunctivae normal.  Cardiovascular:     Rate and Rhythm: Normal rate and regular rhythm.     Heart sounds: No murmur heard. Pulmonary:     Effort: Pulmonary effort is normal. No respiratory distress.     Breath sounds: Normal breath sounds.  Abdominal:     Palpations: Abdomen is soft.  Tenderness: There is no abdominal tenderness.  Musculoskeletal:        General: No swelling.     Cervical back: Neck supple.  Skin:    General: Skin is warm and dry.     Capillary Refill: Capillary refill takes less than 2 seconds.  Neurological:     Mental Status: She is alert.  Psychiatric:        Mood and Affect: Mood normal.     ED Results / Procedures / Treatments   Labs (all labs ordered are listed, but only abnormal results are displayed) Labs Reviewed  CBC WITH DIFFERENTIAL/PLATELET - Abnormal; Notable for the following components:      Result Value   Hemoglobin 11.3 (*)    All other components within normal  limits    EKG None  Radiology No results found.  Procedures Procedures   Medications Ordered in ED Medications - No data to display   ED Course/ Medical Decision Making/ A&P                               Medical Decision Making Amount and/or Complexity of Data Reviewed Labs: ordered.  Risk OTC drugs.   This patient presents to the ED for concern of nosebleed. Differential diagnosis includes epistaxis, nasal trauma, coagulopathy, iron deficiency anemia    Lab Tests:  I Ordered, and personally interpreted labs.  The pertinent results include:  CBC shows hemoglobin at baseline, no indication for concerning acute blood loss  2 Medicines ordered and prescription drug management:  I ordered medication including Afrin  for epistaxis  Reevaluation of the patient after these medicines showed that the patient  I have reviewed the patients home medicines and have made adjustments as needed   Problem List / ED Course:  Patient presented to the ED today with concerns of nosebleeds. States that these have been going off and on for about 3 days. No recent nasal trauma, injury, or procedures to the area. Previously experienced nosebleeds but infrequently. Denies feelings of dizziness, weakness, lightheadedness, or confusion. No viral symptoms at this time such as fever, chills, cough, congestion, headaches, sore throat, or GI disturbance. Otherwise feels comfortable but wanting to make sure her nosebleeds have not caused other problems. Physical exam is reassuring. There is minimal bleeding present and appears to be slowing/stopping. Will administer Afrin to reduce the risk of recurrence. Vasculature is visible which is likely causing these episodes of easy bleeding. May benefit from ENT evaluation for cautery if symptoms persist. No indication of posterior nose bleed. Advised need for close PCP follow up to ensure episodes are happening less often. Discussed strict return precautions  and patient verbalized agreement.  After monitoring for about 1 hour, no recurrence of nose bleed observed.  Patient discharged home in stable condition.  Final Clinical Impression(s) / ED Diagnoses Final diagnoses:  Epistaxis    Rx / DC Orders ED Discharge Orders     None         Smitty Knudsen, PA-C 10/25/22 1014    Arby Barrette, MD 10/30/22 1156

## 2022-10-17 NOTE — ED Triage Notes (Signed)
Pt states nose bleed that started approx 45 min ago  Been bleeding on and off x 3 days

## 2022-10-17 NOTE — ED Notes (Signed)
ED Provider at bedside.
